# Patient Record
Sex: Female | Born: 1980 | Race: White | Hispanic: No | Marital: Married | Smoking: Never smoker
Health system: Southern US, Community
[De-identification: ages and names within clinical notes are randomized; demographics above are authoritative.]

## PROBLEM LIST (undated history)

## (undated) DIAGNOSIS — N329 Bladder disorder, unspecified: Secondary | ICD-10-CM

## (undated) DIAGNOSIS — G43909 Migraine, unspecified, not intractable, without status migrainosus: Secondary | ICD-10-CM

## (undated) HISTORY — DX: Bladder disorder, unspecified: N32.9

## (undated) HISTORY — PX: ABDOMINAL HYSTERECTOMY: SHX81

## (undated) HISTORY — PX: HYSTEROSCOPY: SHX211

## (undated) HISTORY — PX: HERNIA REPAIR: SHX51

## (undated) HISTORY — DX: Migraine, unspecified, not intractable, without status migrainosus: G43.909

---

## 2005-07-20 ENCOUNTER — Inpatient Hospital Stay: Payer: Self-pay | Admitting: Obstetrics & Gynecology

## 2007-02-28 ENCOUNTER — Ambulatory Visit: Payer: Self-pay | Admitting: Obstetrics & Gynecology

## 2007-03-02 ENCOUNTER — Inpatient Hospital Stay: Payer: Self-pay | Admitting: Obstetrics & Gynecology

## 2008-12-18 ENCOUNTER — Observation Stay: Payer: Self-pay | Admitting: Obstetrics and Gynecology

## 2009-01-07 ENCOUNTER — Ambulatory Visit: Payer: Self-pay | Admitting: Obstetrics & Gynecology

## 2009-01-09 ENCOUNTER — Inpatient Hospital Stay: Payer: Self-pay | Admitting: Obstetrics & Gynecology

## 2011-05-06 ENCOUNTER — Ambulatory Visit: Payer: Self-pay | Admitting: Obstetrics & Gynecology

## 2011-05-13 ENCOUNTER — Ambulatory Visit: Payer: Self-pay | Admitting: Obstetrics & Gynecology

## 2011-07-30 LAB — CBC AND DIFFERENTIAL
HCT: 38 % (ref 36–46)
Hemoglobin: 13.4 g/dL (ref 12.0–16.0)

## 2011-07-30 LAB — TSH: TSH: 1.85 u[IU]/mL (ref 0.41–5.90)

## 2013-01-11 ENCOUNTER — Encounter: Payer: Self-pay | Admitting: Internal Medicine

## 2013-01-26 ENCOUNTER — Encounter: Payer: Self-pay | Admitting: Internal Medicine

## 2013-01-26 ENCOUNTER — Other Ambulatory Visit (HOSPITAL_COMMUNITY)
Admission: RE | Admit: 2013-01-26 | Discharge: 2013-01-26 | Disposition: A | Discharge status: Home / Self Care | Payer: BC Managed Care – PPO | Source: Ambulatory Visit | Attending: Internal Medicine | Admitting: Internal Medicine

## 2013-01-26 ENCOUNTER — Ambulatory Visit (INDEPENDENT_AMBULATORY_CARE_PROVIDER_SITE_OTHER): Payer: BC Managed Care – PPO | Admitting: Internal Medicine

## 2013-01-26 VITALS — BP 126/82 | HR 80 | Temp 97.9°F | Resp 18 | Ht 67.0 in | Wt 137.5 lb

## 2013-01-26 DIAGNOSIS — R5381 Other malaise: Secondary | ICD-10-CM

## 2013-01-26 DIAGNOSIS — Z1151 Encounter for screening for human papillomavirus (HPV): Secondary | ICD-10-CM | POA: Insufficient documentation

## 2013-01-26 DIAGNOSIS — N926 Irregular menstruation, unspecified: Secondary | ICD-10-CM

## 2013-01-26 DIAGNOSIS — R5383 Other fatigue: Secondary | ICD-10-CM

## 2013-01-26 DIAGNOSIS — Z124 Encounter for screening for malignant neoplasm of cervix: Secondary | ICD-10-CM

## 2013-01-26 DIAGNOSIS — Z01419 Encounter for gynecological examination (general) (routine) without abnormal findings: Secondary | ICD-10-CM | POA: Insufficient documentation

## 2013-01-26 LAB — COMPREHENSIVE METABOLIC PANEL
AST: 10 U/L (ref 0–37)
Albumin: 4 g/dL (ref 3.5–5.2)
BUN: 16 mg/dL (ref 6–23)
CO2: 27 mEq/L (ref 19–32)
Calcium: 8.8 mg/dL (ref 8.4–10.5)
Chloride: 102 mEq/L (ref 96–112)
Creatinine, Ser: 0.7 mg/dL (ref 0.4–1.2)
GFR: 103.7 mL/min (ref >60.00)
Glucose, Bld: 93 mg/dL (ref 70–99)

## 2013-01-26 LAB — CBC WITH DIFFERENTIAL/PLATELET
Basophils Absolute: 0 10*3/uL (ref 0.0–0.1)
Basophils Relative: 0.3 % (ref 0.0–3.0)
HCT: 38.8 % (ref 36.0–46.0)
Hemoglobin: 12.9 g/dL (ref 12.0–15.0)
Lymphs Abs: 1.6 10*3/uL (ref 0.7–4.0)
Monocytes Relative: 4.6 % (ref 3.0–12.0)
Neutro Abs: 8.5 10*3/uL — ABNORMAL HIGH (ref 1.4–7.7)
RBC: 4.3 Mil/uL (ref 3.87–5.11)
RDW: 13.5 % (ref 11.5–14.6)

## 2013-01-26 LAB — HCG, QUANTITATIVE, PREGNANCY: hCG, Beta Chain, Quant, S: 0.1 m[IU]/mL

## 2013-01-28 ENCOUNTER — Encounter: Payer: Self-pay | Admitting: Internal Medicine

## 2013-01-28 DIAGNOSIS — N926 Irregular menstruation, unspecified: Secondary | ICD-10-CM | POA: Insufficient documentation

## 2013-01-28 NOTE — Assessment & Plan Note (Signed)
**Note De-Identified E[place]leton Obfuscation** Spotting and bleeding as outlined.  Check serum pregnancy test.  Is s/p hysteroscopy.  Persistent discharge now. With BRB recently.  Will refer back to GYN for evaluation.

## 2013-01-28 NOTE — Progress Notes (Signed)
**Note De-identified Lemme Obfuscation**  **Note De-Identified Ocain Obfuscation** Subjective:    Patient ID: Sandy Jefferson, female    DOB: Jul 30, 1981, 32 y.o.   MRN: [id number]  HPI 32 year old female with past history of previous irregular menstrual period.  Is s/p hysteroscopy.  Here today to follow up on this as well as for her complete physical exam and to transfer her care here to [facility].  She also had a hernia repair.  States still doing well with the hernia repair.  Did well for approximately 1-1 1/2 years regarding her periods after the hysteroscopy.  Now will intermittently notice some sharp lower pelvic pain.  Occasional spotting over the last several months.  Discharge.  Recently had BRB.  Some clots.  Heavier bleeding.  Some fatigue.  Weight gain.  Just spotting now.  She is currently being treated for poison oak.  Taking prednisone.  Better.  No sob.  No increased congestion.  Bowels stable.  Some problems with her knees.  Right > left.  Some problems with getting up after down on her knees.     Past Medical History  Diagnosis Date  . Migraine headache   . Bladder troubles     Review of Systems Patient denies any headache, lightheadedness or dizziness.  No sinus or allergy symptoms.  No chest pain, tightness or palpitations.  No increased shortness of breath, cough or congestion.  No nausea or vomiting.  No acid reflux.  Some intermittent lower pelvic pain as outlined.   No bowel change, such as diarrhea, constipation, BRBPR or melana.  No urine change.   Menstrual change as outlined.       Objective:   Physical Exam Filed Vitals:   01/26/13 1101  BP: 126/82  Pulse: 80  Temp: 97.9 F (36.6 C)  Resp: 38   32 year old female in no acute distress.   HEENT:  Nares- clear.  Oropharynx - without lesions. NECK:  Supple.  Nontender.  No audible bruit.  HEART:  Appears to be regular. LUNGS:  No crackles or wheezing audible.  Respirations even and unlabored.  RADIAL PULSE:  Equal bilaterally.    BREASTS:  No nipple discharge or nipple retraction present.  Could  not appreciate any distinct nodules or axillary adenopathy.  ABDOMEN:  Soft, nontender.  Bowel sounds present and normal.  No audible abdominal bruit.  GU:  Normal external genitalia.  Vaginal vault without lesions.  Cervix identified - no polyp.   Pap performed. Could not appreciate any adnexal masses or tenderness.   RECTAL:  Not performed.    EXTREMITIES:  No increased edema present.  DP pulses palpable and equal bilaterally.          Assessment & Plan:  FATIGUE.  Will check cbc, met c and tsh.   HERNIA.  Has done well s/p surgery.  No pain or problems.  Follow.   DERM.  With poison oak. On prednisone.  Better.  Follow.    WEIGHT GAIN.  Will check tsh.  Follow.   HEALTH MAINTENANCE.  Physical today.  Pelvic/pap today.

## 2013-01-29 ENCOUNTER — Telehealth: Payer: Self-pay | Admitting: Internal Medicine

## 2013-01-29 NOTE — Telephone Encounter (Signed)
**Note De-Identified Bonfanti Obfuscation** FYI - she wants to hold on the referral.

## 2013-01-29 NOTE — Telephone Encounter (Signed)
**Note De-Identified Messineo Obfuscation** Message copied by Charm Barges on Mon Jan 29, 2013  6:17 PM ------      Message from: Marlene Lard      Created: Mon Jan 29, 2013  5:21 PM       Patient said that she is going to wait a couple of weeks and then let us know. ------

## 2013-01-30 ENCOUNTER — Encounter: Payer: Self-pay | Admitting: Internal Medicine

## 2014-01-03 ENCOUNTER — Telehealth: Payer: Self-pay | Admitting: Internal Medicine

## 2014-01-03 NOTE — Telephone Encounter (Signed)
**Note De-Identified Ferre Obfuscation** Pt left vm to needing to schedule annual cpe.  Pt has already been scheduled 4/7 @ 10:30 a.m.  Unable to leave msg on home phone (per DPR), no machine picked up.

## 2014-01-29 ENCOUNTER — Ambulatory Visit (INDEPENDENT_AMBULATORY_CARE_PROVIDER_SITE_OTHER): Payer: BC Managed Care – PPO | Admitting: Internal Medicine

## 2014-01-29 ENCOUNTER — Encounter: Payer: Self-pay | Admitting: Internal Medicine

## 2014-01-29 VITALS — BP 110/70 | HR 67 | Temp 98.6°F | Ht 64.5 in | Wt 145.5 lb

## 2014-01-29 DIAGNOSIS — R5381 Other malaise: Secondary | ICD-10-CM

## 2014-01-29 DIAGNOSIS — M549 Dorsalgia, unspecified: Secondary | ICD-10-CM

## 2014-01-29 DIAGNOSIS — Z13220 Encounter for screening for lipoid disorders: Secondary | ICD-10-CM

## 2014-01-29 DIAGNOSIS — N926 Irregular menstruation, unspecified: Secondary | ICD-10-CM

## 2014-01-29 DIAGNOSIS — R5383 Other fatigue: Secondary | ICD-10-CM

## 2014-01-29 LAB — CBC WITH DIFFERENTIAL/PLATELET
BASOS ABS: 0 10*3/uL (ref 0.0–0.1)
BASOS PCT: 0.5 % (ref 0.0–3.0)
EOS PCT: 6.9 % — AB (ref 0.0–5.0)
Eosinophils Absolute: 0.5 10*3/uL (ref 0.0–0.7)
HEMATOCRIT: 38 % (ref 36.0–46.0)
HEMOGLOBIN: 12.8 g/dL (ref 12.0–15.0)
LYMPHS ABS: 2.1 10*3/uL (ref 0.7–4.0)
Lymphocytes Relative: 29.4 % (ref 12.0–46.0)
MCHC: 33.7 g/dL (ref 30.0–36.0)
MCV: 92 fl (ref 78.0–100.0)
MONOS PCT: 4.7 % (ref 3.0–12.0)
Monocytes Absolute: 0.3 10*3/uL (ref 0.1–1.0)
NEUTROS ABS: 4.1 10*3/uL (ref 1.4–7.7)
Neutrophils Relative %: 58.5 % (ref 43.0–77.0)
Platelets: 202 10*3/uL (ref 150.0–400.0)
RBC: 4.13 Mil/uL (ref 3.87–5.11)
RDW: 13.3 % (ref 11.5–14.6)
WBC: 7.1 10*3/uL (ref 4.5–10.5)

## 2014-01-29 LAB — COMPREHENSIVE METABOLIC PANEL
ALT: 15 U/L (ref 0–35)
AST: 13 U/L (ref 0–37)
Albumin: 4.2 g/dL (ref 3.5–5.2)
Alkaline Phosphatase: 27 U/L — ABNORMAL LOW (ref 39–117)
BILIRUBIN TOTAL: 1 mg/dL (ref 0.3–1.2)
BUN: 17 mg/dL (ref 6–23)
CALCIUM: 9.1 mg/dL (ref 8.4–10.5)
CO2: 28 meq/L (ref 19–32)
CREATININE: 0.7 mg/dL (ref 0.4–1.2)
Chloride: 106 mEq/L (ref 96–112)
GFR: 101.38 mL/min (ref >60.00)
GLUCOSE: 83 mg/dL (ref 70–99)
Potassium: 4.4 mEq/L (ref 3.5–5.1)
Sodium: 139 mEq/L (ref 135–145)
Total Protein: 6.8 g/dL (ref 6.0–8.3)

## 2014-01-29 LAB — LIPID PANEL
CHOLESTEROL: 177 mg/dL (ref 0–200)
HDL: 42.7 mg/dL (ref >39.00)
LDL Cholesterol: 122 mg/dL — ABNORMAL HIGH (ref 0–99)
TRIGLYCERIDES: 60 mg/dL (ref 0.0–149.0)
Total CHOL/HDL Ratio: 4
VLDL: 12 mg/dL (ref 0.0–40.0)

## 2014-01-29 LAB — SEDIMENTATION RATE: Sed Rate: 12 mm/hr (ref 0–22)

## 2014-01-29 LAB — TSH: TSH: 1.91 u[IU]/mL (ref 0.35–5.50)

## 2014-01-29 NOTE — Progress Notes (Signed)
**Note De-identified Twichell Obfuscation** Pre-visit discussion using our clinic review tool. No additional management support is needed unless otherwise documented below in the visit note.  

## 2014-01-29 NOTE — Progress Notes (Signed)
**Note De-identified Upadhyay Obfuscation**  **Note De-Identified Sutherland Obfuscation** Subjective:    Patient ID: Sandy Jefferson, female    DOB: 08-13-1981, 33 y.o.   MRN: [id number]  HPI 33 year old female with past history of previous irregular menstrual period.  Is s/p hysteroscopy.  Here today to follow up on this as well as for her complete physical exam.   She also had a hernia repair.  States still doing well with the hernia repair.  Will intermittently notice some sharp lower pelvic pain.  Occasional spotting over the last several months.  Discharge.  Recently had BRB.  Some clots.  Heavier bleeding.  Some fatigue.  These are similar symptoms that she was having last year.  Saw gyn.  States had "cyst".  Followed with serial ultrasounds.  States when this was smaller, she felt better.  Did well for approximately 6 months and then the symptoms returned.  Started two months ago.  No pain today.  Is intermittent.  Has noticed the fatigue and weight gain again.  No sob.  No increased congestion.  Bowels stable.      Past Medical History  Diagnosis Date  . Migraine headache   . Bladder troubles     Review of Systems Patient denies any headache, lightheadedness or dizziness.  No sinus or allergy symptoms.  No chest pain, tightness or palpitations.  No increased shortness of breath, cough or congestion.  No nausea or vomiting.  No acid reflux.  Some intermittent lower pelvic pain as outlined.   No bowel change, such as diarrhea, constipation, BRBPR or melana.  No urine change.   Menstrual change as outlined.  Describes BRB and clots.  Reports some low back pain.  No radiation of pain.      Objective:   Physical Exam  Filed Vitals:   01/29/14 1036  BP: 110/70  Pulse: 67  Temp: 98.6 F (54 C)   33 year old female in no acute distress.   HEENT:  Nares- clear.  Oropharynx - without lesions. NECK:  Supple.  Nontender.  No audible bruit.  HEART:  Appears to be regular. LUNGS:  No crackles or wheezing audible.  Respirations even and unlabored.  RADIAL PULSE:  Equal bilaterally.     BREASTS:  No nipple discharge or nipple retraction present.  Could not appreciate any distinct nodules or axillary adenopathy.  ABDOMEN:  Soft, nontender.  Bowel sounds present and normal.  No audible abdominal bruit.  GU:  Not performed.    EXTREMITIES:  No increased edema present.  DP pulses palpable and equal bilaterally.          Assessment & Plan:  FATIGUE.  Will check cbc, met c and tsh.   HERNIA.  Has done well s/p surgery.  No pain or problems.  Follow.   WEIGHT GAIN.  Will check tsh.  Follow.   HEALTH MAINTENANCE.  Physical today.  Pelvic/pap 01/26/13 negative pap with negative HPV.

## 2014-01-30 ENCOUNTER — Encounter: Payer: Self-pay | Admitting: *Deleted

## 2014-02-03 ENCOUNTER — Encounter: Payer: Self-pay | Admitting: Internal Medicine

## 2014-02-03 ENCOUNTER — Other Ambulatory Visit: Payer: Self-pay | Admitting: Internal Medicine

## 2014-02-03 DIAGNOSIS — M549 Dorsalgia, unspecified: Secondary | ICD-10-CM | POA: Insufficient documentation

## 2014-02-03 DIAGNOSIS — N926 Irregular menstruation, unspecified: Secondary | ICD-10-CM

## 2014-02-03 DIAGNOSIS — R102 Pelvic and perineal pain: Secondary | ICD-10-CM

## 2014-02-03 NOTE — Assessment & Plan Note (Signed)
**Note De-Identified Robillard Obfuscation** Stretches.  Check esr.  Get gyn problems evaluated.  Back pain may improve.

## 2014-02-03 NOTE — Progress Notes (Signed)
**Note De-identified Cripps Obfuscation** Order placed for gyn referral.  

## 2014-02-03 NOTE — Assessment & Plan Note (Signed)
**Note De-Identified Sciarra Obfuscation** Spotting and bleeding as outlined.   Is s/p hysteroscopy.  With BRB recently.  Intermittent bleeding and spotting now.  Will refer back to GYN for evaluation.  She desires second opinion.

## 2014-02-06 ENCOUNTER — Encounter: Payer: Self-pay | Admitting: Emergency Medicine

## 2014-04-24 HISTORY — PX: TOTAL ABDOMINAL HYSTERECTOMY W/ BILATERAL SALPINGOOPHORECTOMY: SHX83

## 2014-05-01 ENCOUNTER — Ambulatory Visit: Payer: Self-pay | Admitting: Obstetrics and Gynecology

## 2014-05-01 LAB — BASIC METABOLIC PANEL
Anion Gap: 7 (ref 7–16)
BUN: 16 mg/dL (ref 7–18)
CHLORIDE: 104 mmol/L (ref 98–107)
CREATININE: 0.81 mg/dL (ref 0.60–1.30)
Calcium, Total: 8.4 mg/dL — ABNORMAL LOW (ref 8.5–10.1)
Co2: 29 mmol/L (ref 21–32)
EGFR (African American): >60
EGFR (Non-African Amer.): >60
Glucose: 94 mg/dL (ref 65–99)
Osmolality: 280 (ref 275–301)
POTASSIUM: 4.1 mmol/L (ref 3.5–5.1)
Sodium: 140 mmol/L (ref 136–145)

## 2014-05-01 LAB — CBC
HCT: 38.2 % (ref 35.0–47.0)
HGB: 12.9 g/dL (ref 12.0–16.0)
MCH: 31.5 pg (ref 26.0–34.0)
MCHC: 33.7 g/dL (ref 32.0–36.0)
MCV: 93 fL (ref 80–100)
Platelet: 209 10*3/uL (ref 150–440)
RBC: 4.09 10*6/uL (ref 3.80–5.20)
RDW: 12.6 % (ref 11.5–14.5)
WBC: 7.9 10*3/uL (ref 3.6–11.0)

## 2014-05-06 ENCOUNTER — Inpatient Hospital Stay: Payer: Self-pay | Admitting: Obstetrics and Gynecology

## 2014-05-07 LAB — HEMOGLOBIN: HGB: 10.7 g/dL — AB (ref 12.0–16.0)

## 2014-05-09 LAB — PATHOLOGY REPORT

## 2014-05-28 ENCOUNTER — Ambulatory Visit: Payer: BC Managed Care – PPO

## 2014-05-28 ENCOUNTER — Telehealth: Payer: Self-pay | Admitting: *Deleted

## 2014-05-28 NOTE — Telephone Encounter (Signed)
**Note De-Identified Rawdon Obfuscation** Do you have a copy of the form.  I did not get a copy.  Thanks.

## 2014-05-28 NOTE — Telephone Encounter (Signed)
**Note De-Identified Mesenbrink Obfuscation** Pt presented to clinic for TB test. Prior to giving, pt stated that she was starting Prednisone today x 14 days for severe poison oak. Dr. Nicki Reaper notified and pt advised to not have TB test done today and to wait 4 weeks after completing Prednisone. Pt verbalized understanding, will schedule a nurse visit in 6 weeks. 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 Her last visit was 01/29/14 for cpe. Form given to Dr. Nicki Reaper. 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 States had Hep B in college, but does not feel that labs/immunization necessary for form completion. Advised to check with school system to verify that she does not need these done.

## 2014-05-29 DIAGNOSIS — Z0279 Encounter for issue of other medical certificate: Secondary | ICD-10-CM

## 2014-05-30 NOTE — Telephone Encounter (Signed)
**Note De-Identified Cottier Obfuscation** Left patient on voicemail informing her that her form has been completed. Need to know if she wants it mailed, faxed, or picked up.

## 2014-06-13 NOTE — Telephone Encounter (Signed)
**Note De-Identified Asper Obfuscation** Never heard back from patient. Mailed form back to patient & sent copy to scan

## 2014-07-10 ENCOUNTER — Ambulatory Visit (INDEPENDENT_AMBULATORY_CARE_PROVIDER_SITE_OTHER): Payer: BC Managed Care – PPO | Admitting: *Deleted

## 2014-07-10 DIAGNOSIS — Z111 Encounter for screening for respiratory tuberculosis: Secondary | ICD-10-CM

## 2014-07-12 LAB — TB SKIN TEST
Induration: 0 mm
TB Skin Test: NEGATIVE

## 2014-07-29 ENCOUNTER — Telehealth: Payer: Self-pay | Admitting: Internal Medicine

## 2014-07-29 NOTE — Telephone Encounter (Signed)
**Note De-Identified Quinones Obfuscation** LMTCB to change appt to 8:30 on 10/7.msn

## 2014-07-31 ENCOUNTER — Encounter: Payer: Self-pay | Admitting: Internal Medicine

## 2014-07-31 ENCOUNTER — Ambulatory Visit (INDEPENDENT_AMBULATORY_CARE_PROVIDER_SITE_OTHER): Payer: BC Managed Care – PPO | Admitting: Internal Medicine

## 2014-07-31 VITALS — BP 120/78 | HR 74 | Temp 98.0°F | Ht 64.5 in | Wt 143.0 lb

## 2014-07-31 DIAGNOSIS — R5383 Other fatigue: Secondary | ICD-10-CM

## 2014-07-31 DIAGNOSIS — E0789 Other specified disorders of thyroid: Secondary | ICD-10-CM

## 2014-07-31 DIAGNOSIS — E2839 Other primary ovarian failure: Secondary | ICD-10-CM

## 2014-07-31 LAB — CBC WITH DIFFERENTIAL/PLATELET
BASOS ABS: 0 10*3/uL (ref 0.0–0.1)
Basophils Relative: 0.5 % (ref 0.0–3.0)
EOS ABS: 0.2 10*3/uL (ref 0.0–0.7)
Eosinophils Relative: 2.8 % (ref 0.0–5.0)
HEMATOCRIT: 39.2 % (ref 36.0–46.0)
Hemoglobin: 13.1 g/dL (ref 12.0–15.0)
LYMPHS ABS: 2.2 10*3/uL (ref 0.7–4.0)
Lymphocytes Relative: 33.1 % (ref 12.0–46.0)
MCHC: 33.3 g/dL (ref 30.0–36.0)
MCV: 90.5 fl (ref 78.0–100.0)
MONO ABS: 0.4 10*3/uL (ref 0.1–1.0)
Monocytes Relative: 5.4 % (ref 3.0–12.0)
Neutro Abs: 3.8 10*3/uL (ref 1.4–7.7)
Neutrophils Relative %: 58.2 % (ref 43.0–77.0)
PLATELETS: 214 10*3/uL (ref 150.0–400.0)
RBC: 4.33 Mil/uL (ref 3.87–5.11)
RDW: 13.2 % (ref 11.5–15.5)
WBC: 6.6 10*3/uL (ref 4.0–10.5)

## 2014-07-31 LAB — TSH: TSH: 2 u[IU]/mL (ref 0.35–4.50)

## 2014-07-31 NOTE — Progress Notes (Signed)
**Note De-identified Acebo Obfuscation** Pre visit review using our clinic review tool, if applicable. No additional management support is needed unless otherwise documented below in the visit note. 

## 2014-08-01 ENCOUNTER — Encounter: Payer: Self-pay | Admitting: *Deleted

## 2014-08-04 ENCOUNTER — Encounter: Payer: Self-pay | Admitting: Internal Medicine

## 2014-08-04 DIAGNOSIS — E2839 Other primary ovarian failure: Secondary | ICD-10-CM | POA: Insufficient documentation

## 2014-08-04 NOTE — Assessment & Plan Note (Signed)
**Note De-Identified Blodgett Obfuscation** Thyroid fullness noted on exam.  Check tsh.  Check thyroid ultrasound.

## 2014-08-04 NOTE — Progress Notes (Signed)
**Note De-identified Villalon Obfuscation**  **Note De-Identified Oche[place] Obfuscation** Subjective:    Patient ID: Sandy Jefferson, female    DOB: [birth year], 33 y.o.   MRN: [id number]  HPI 33 year old female with past history of previous irregular menstrual period.  Is s/p hysteroscopy initially and now s/p TAH/BSO.   Here today for a scheduled follow up.   Has noticed some increased  fatigue.  Breathing stable.  No cardiac symptoms with increased activity or exertion.  No acid reflux.  No bowel change.  Eating and drinking well.  Seeing Dr Enzo Bi.  On estrogen.  States if she takes the whole estrogen, too sleepy.  Just started 1/2 tablet per day.       Past Medical History  Diagnosis Date  . Migraine headache   . Bladder troubles     Review of Systems Patient denies any headache, lightheadedness or dizziness.  No sinus or allergy symptoms.  No chest pain, tightness or palpitations.  No increased shortness of breath, cough or congestion.  No nausea or vomiting.  No acid reflux.  No bowel change, such as diarrhea, constipation, BRBPR or melana.  No urine change.  Is s/p TAH/BSO.  On estrogen.  Trying to get regulated.  Some fatigue.       Objective:   Physical Exam  Filed Vitals:   07/31/14 0806  BP: 120/78  Pulse: 74  Temp: 98 F (36.7 C)  blood pressure recheck:  61/57  33 year old female in no acute distress.   HEENT:  Nares- clear.  Oropharynx - without lesions. NECK:  Supple.  Nontender.  No audible bruit.  Thyroid fullness noted.   HEART:  Appears to be regular. LUNGS:  No crackles or wheezing audible.  Respirations even and unlabored.  RADIAL PULSE:  Equal bilaterally.   ABDOMEN:  Soft, nontender.  Bowel sounds present and normal.  No audible abdominal bruit.  EXTREMITIES:  No increased edema present.  DP pulses palpable and equal bilaterally.          Assessment & Plan:  FATIGUE.  Will check cbc, met c and tsh.  Recent surgery.  Still recovering.  Regulating her hormones.   HERNIA.  Has done well s/p surgery.  No pain or problems.  Follow.   HEALTH  MAINTENANCE.  Physical 01/29/14. Marland Kitchen  Pelvic/pap 01/26/13 negative pap with negative HPV.    I spent 25 minutes with the patient and more than 50% of the time was spent in consultation regarding the above.

## 2014-08-04 NOTE — Assessment & Plan Note (Signed)
**Note De-Identified Larimer Obfuscation** Is s/p TAH/BSO.  Regulating her estrogen dosing.  Is now on 1/2 q day.  Follow.

## 2014-08-20 ENCOUNTER — Telehealth: Payer: Self-pay | Admitting: Internal Medicine

## 2014-08-20 NOTE — Telephone Encounter (Signed)
**Note De-Identified Florio Obfuscation** Caller name: [facility], [facility] Relation to pt: self  Call back number: [phone] (new cell phone # Pharmacy:  Reason for call: pt returning your call

## 2014-08-27 ENCOUNTER — Ambulatory Visit: Payer: Self-pay | Admitting: Internal Medicine

## 2014-08-28 ENCOUNTER — Encounter: Payer: Self-pay | Admitting: Internal Medicine

## 2014-08-28 ENCOUNTER — Telehealth: Payer: Self-pay | Admitting: Internal Medicine

## 2014-08-28 DIAGNOSIS — E041 Nontoxic single thyroid nodule: Secondary | ICD-10-CM

## 2014-08-28 NOTE — Telephone Encounter (Signed)
**Note De-Identified Herrmann Obfuscation** Pt notified of thyroid ultrasound results.  Thyroid nodule 1.1cm.  Agrees to referral to endocrinology.  Order for referral placed.

## 2014-10-03 ENCOUNTER — Telehealth: Payer: Self-pay

## 2014-10-03 NOTE — Telephone Encounter (Signed)
**Note De-Identified Gros Obfuscation** Pt was scheduled with Dr Loanne Drilling for 10/08/14 for evaluation of thyroid nodule.

## 2014-10-03 NOTE — Telephone Encounter (Signed)
**Note De-Identified Cedeno Obfuscation** The patient called and stated she has spoken with [facility] and they are unable to get in her before the end of the year.  She is wondering if any of the [facility] endocrinologist do biopsies?

## 2014-10-03 NOTE — Telephone Encounter (Signed)
**Note De-Identified Derryberry Obfuscation** Please advise (this is something that could also be available through Radiology @ [facility])

## 2014-10-04 ENCOUNTER — Ambulatory Visit: Payer: BC Managed Care – PPO | Admitting: Internal Medicine

## 2014-10-08 ENCOUNTER — Ambulatory Visit: Payer: BC Managed Care – PPO | Admitting: Endocrinology

## 2014-10-08 ENCOUNTER — Ambulatory Visit (INDEPENDENT_AMBULATORY_CARE_PROVIDER_SITE_OTHER): Payer: BC Managed Care – PPO | Admitting: Endocrinology

## 2014-10-08 ENCOUNTER — Encounter: Payer: Self-pay | Admitting: Endocrinology

## 2014-10-08 VITALS — BP 120/82 | HR 83 | Temp 98.0°F | Ht 64.5 in | Wt 141.0 lb

## 2014-10-08 DIAGNOSIS — E041 Nontoxic single thyroid nodule: Secondary | ICD-10-CM

## 2014-10-08 NOTE — Patient Instructions (Addendum)
**Note De-Identified Rucci Obfuscation** you will receive a phone call, about a day and time for an appointment, for the biopsy. If no cancer is seen, Please come back for a follow-up appointment in 6 months. If no cancer is seen, you could investigate other causes of the swelling symptoms.

## 2014-10-08 NOTE — Progress Notes (Signed)
**Note De-Identified Sorlie Obfuscation** Subjective:    Patient ID: Sandy Jefferson, female    DOB: [birth year], 33 y.o.   MRN: [id number]  HPI 1 month ago, pt was noted on physical exam to have slight swelling at the anterior neck, but no assoc pain.  she has no h/o XRT to the neck.   Past Medical History  Diagnosis Date  . Migraine headache   . Bladder troubles     Past Surgical History  Procedure Laterality Date  . Cesarean section    . Hysteroscopy    . Hernia repair      History   Social History  . Marital Status: Married    Spouse Name: N/A    Number of Children: 4  . Years of Education: N/A   Occupational History  . Not on file.   Social History Main Topics  . Smoking status: Never Smoker   . Smokeless tobacco: Never Used  . Alcohol Use: Yes     Comment: rarely  . Drug Use: No  . Sexual Activity: Not on file   Other Topics Concern  . Not on file   Social History Narrative    No current outpatient prescriptions on file prior to visit.   No current facility-administered medications on file prior to visit.    Allergies  Allergen Reactions  . Aspirin     Family History  Problem Relation Age of Onset  . Heart disease Brother   . Hyperlipidemia Mother   . Hyperlipidemia Father   . Cancer Maternal Aunt     breast  . Stroke Maternal Grandmother   . Hyperlipidemia Maternal Grandfather   . Cancer Paternal Grandmother     lung  . Diabetes Paternal Grandmother   . Diabetes Paternal Grandfather   . Colon cancer      great grandmother    BP 120/82 mmHg  Pulse 83  Temp(Src) 98 F (36.7 C) (Oral)  Ht 5' 4.5" (1.638 m)  Wt 141 lb (63.957 kg)  BMI 23.84 kg/m2  SpO2 97%  Review of Systems denies depression, hair loss, muscle cramps, sob, constipation, numbness, blurry vision, cold intolerance, dry skin, rhinorrhea, and syncope.  She has gained weight.  She has easy bruising.    Objective:   Physical Exam VS: see vs page GEN: no distress HEAD: head: no deformity eyes: no periorbital  swelling, no proptosis external nose and ears are normal mouth: no lesion seen NECK: I can feel slight fullness at the right thyroid area, but I can't tell the nodule itself with certainty. CHEST WALL: no deformity LUNGS:  Clear to auscultation.  CV: reg rate and rhythm, no murmur MUSCULOSKELETAL: muscle bulk and strength are grossly normal.  no obvious joint swelling.  gait is normal and steady EXTEMITIES: no deformity.  no edema NEURO:  cn 2-12 grossly intact.   readily moves all 4's.  sensation is intact to touch on all 4's SKIN:  Normal texture and temperature.  No rash or suspicious lesion is visible.   NODES:  None palpable at the neck. PSYCH: alert, well-oriented.  Does not appear anxious nor depressed.  Lab Results  Component Value Date   TSH 2.00 07/31/2014   Radiol: i reviewed [place] report    Assessment & Plan:  Thyroid nodule, new: pt requests bx Neck swelling: does not appear to be thyroid-related.   Patient is advised the following: Patient Instructions  you will receive a phone call, about a day and time for an appointment, for the biopsy. If **Note De-Identified Huaracha Obfuscation** no cancer is seen, Please come back for a follow-up appointment in 6 months. If no cancer is seen, you could investigate other causes of the swelling symptoms.

## 2014-10-10 ENCOUNTER — Ambulatory Visit: Payer: BC Managed Care – PPO | Admitting: Endocrinology

## 2014-11-08 ENCOUNTER — Telehealth: Payer: Self-pay | Admitting: Endocrinology

## 2014-11-08 NOTE — Telephone Encounter (Signed)
**Note De-Identified Lemarr Obfuscation** Juanita from [facility], stated that she need and order from Dr Loanne Drilling in order to do procedure on patient,  Fax # [phone]

## 2014-11-08 NOTE — Telephone Encounter (Addendum)
**Note De-Identified Rudell Obfuscation** Orders faxed to [facility].

## 2014-11-11 ENCOUNTER — Ambulatory Visit: Payer: Self-pay | Admitting: Endocrinology

## 2014-11-13 ENCOUNTER — Telehealth: Payer: Self-pay | Admitting: Endocrinology

## 2014-11-13 DIAGNOSIS — E041 Nontoxic single thyroid nodule: Secondary | ICD-10-CM

## 2014-11-13 NOTE — Telephone Encounter (Signed)
**Note De-Identified Minix Obfuscation** i called pt.  Ref surg

## 2014-12-03 ENCOUNTER — Encounter: Payer: Self-pay | Admitting: Endocrinology

## 2014-12-03 ENCOUNTER — Encounter: Payer: Self-pay | Admitting: Nurse Practitioner

## 2014-12-03 ENCOUNTER — Ambulatory Visit (INDEPENDENT_AMBULATORY_CARE_PROVIDER_SITE_OTHER): Payer: BLUE CROSS/BLUE SHIELD | Admitting: Nurse Practitioner

## 2014-12-03 VITALS — BP 110/62 | HR 69 | Temp 97.6°F | Resp 12 | Ht 64.5 in | Wt 147.8 lb

## 2014-12-03 DIAGNOSIS — R21 Rash and other nonspecific skin eruption: Secondary | ICD-10-CM | POA: Insufficient documentation

## 2014-12-03 DIAGNOSIS — J0100 Acute maxillary sinusitis, unspecified: Secondary | ICD-10-CM

## 2014-12-03 DIAGNOSIS — J019 Acute sinusitis, unspecified: Secondary | ICD-10-CM | POA: Insufficient documentation

## 2014-12-03 NOTE — Progress Notes (Signed)
**Note De-identified Storck Obfuscation** Pre visit review using our clinic review tool, if applicable. No additional management support is needed unless otherwise documented below in the visit note. 

## 2014-12-03 NOTE — Assessment & Plan Note (Addendum)
**Note De-Identified Salmela Obfuscation** Stable. Only 5 days into course. Probably viral. Father has similar symptoms. Will try OTC alkaseltzer, flonase, and benadry. FU prn worsening/failure to improve.

## 2014-12-03 NOTE — Assessment & Plan Note (Signed)
**Note De-Identified Canion Obfuscation** Worsening. Will try benadryl OTC. If not helpful will defer to her dermatologist. FU prn worsening/failure to improve.

## 2014-12-03 NOTE — Patient Instructions (Addendum)
**Note De-Identified Davie Obfuscation** Follow up if worsening/failure to improve.   Viral Infections A viral infection can be caused by different types of viruses.Most viral infections are not serious and resolve on their own. However, some infections may cause severe symptoms and may lead to further complications. SYMPTOMS Viruses can frequently cause:  Minor sore throat.  Aches and pains.  Headaches.  Runny nose.  Different types of rashes.  Watery eyes.  Tiredness.  Cough.  Loss of appetite.  Gastrointestinal infections, resulting in nausea, vomiting, and diarrhea. These symptoms do not respond to antibiotics because the infection is not caused by bacteria. However, you might catch a bacterial infection following the viral infection. This is sometimes called a "superinfection." Symptoms of such a bacterial infection may include:  Worsening sore throat with pus and difficulty swallowing.  Swollen neck glands.  Chills and a high or persistent fever.  Severe headache.  Tenderness over the sinuses.  Persistent overall ill feeling (malaise), muscle aches, and tiredness (fatigue).  Persistent cough.  Yellow, green, or brown mucus production with coughing. HOME CARE INSTRUCTIONS   Only take over-the-counter or prescription medicines for pain, discomfort, diarrhea, or fever as directed by your caregiver.  Drink enough water and fluids to keep your urine clear or pale yellow. Sports drinks can provide valuable electrolytes, sugars, and hydration.  Get plenty of rest and maintain proper nutrition. Soups and broths with crackers or rice are fine. SEEK IMMEDIATE MEDICAL CARE IF:   You have severe headaches, shortness of breath, chest pain, neck pain, or an unusual rash.  You have uncontrolled vomiting, diarrhea, or you are unable to keep down fluids.  You or your child has an oral temperature above 102 F (38.9 C), not controlled by medicine.  Your baby is older than 3 months with a rectal temperature of  102 F (38.9 C) or higher.  Your baby is 38 months old or younger with a rectal temperature of 100.4 F (38 C) or higher. MAKE SURE YOU:   Understand these instructions.  Will watch your condition.  Will get help right away if you are not doing well or get worse. Document Released: 07/21/2005 Document Revised: 01/03/2012 Document Reviewed: 02/15/2011 [facility] Patient Information 2015 [place], [place]. This information is not intended to replace advice given to you by your health care provider. Make sure you discuss any questions you have with your health care provider.

## 2014-12-03 NOTE — Progress Notes (Signed)
**Note De-Identified [place] Obfuscation** Subjective:    Patient ID: Sandy Jefferson, female    DOB: 11/06/1980, 34 y.o.   MRN: [id number]  HPI  Sandy Jefferson is a 34 yo female with a CC of rash on both elbows and sinus pressure x 5 days.   1) Rash- a week ago elbows popped up and lotion and other creams make it "mad". Triamcinolone- not helpful, red and tender. Pruritic when pressure applied to elbow.   Biopsy of thyroid right side of neck. Noticed pain on right side and itchy rash on posterior neck under hairline. Bx date 11/11/2014.   3) Pressure of maxillary sinuses, rhinorrhea draining, sore throat, mucus- green/yellow from throat x 5 days. Can not smell or taste anything. X 5 days   Treatment- Afrin, alkaseltzer for nasal congestion, Nyquil at night. Helpful a little bit.     Review of Systems  Constitutional: Negative for fever, chills, diaphoresis and fatigue.  HENT: Positive for congestion, postnasal drip, rhinorrhea, sinus pressure, sneezing and voice change. Negative for ear discharge, ear pain and sore throat.        Bx of thyroid- hoarse  Eyes: Negative for visual disturbance.  Respiratory: Negative for chest tightness, shortness of breath and wheezing.   Cardiovascular: Negative for chest pain, palpitations and leg swelling.  Gastrointestinal: Negative for nausea, vomiting and diarrhea.  Skin: Positive for rash.  Neurological: Positive for headaches. Negative for dizziness and weakness.   Past Medical History  Diagnosis Date  . Migraine headache   . Bladder troubles     History   Social History  . Marital Status: Married    Spouse Name: N/A    Number of Children: 4  . Years of Education: N/A   Occupational History  . Not on file.   Social History Main Topics  . Smoking status: Never Smoker   . Smokeless tobacco: Never Used  . Alcohol Use: Yes     Comment: rarely  . Drug Use: No  . Sexual Activity: Not on file   Other Topics Concern  . Not on file   Social History Narrative    Past Surgical  History  Procedure Laterality Date  . Cesarean section    . Hysteroscopy    . Hernia repair      Family History  Problem Relation Age of Onset  . Heart disease Brother   . Hyperlipidemia Mother   . Hyperlipidemia Father   . Cancer Maternal Aunt     breast  . Stroke Maternal Grandmother   . Hyperlipidemia Maternal Grandfather   . Cancer Paternal Grandmother     lung  . Diabetes Paternal Grandmother   . Diabetes Paternal Grandfather   . Colon cancer      great grandmother    Allergies  Allergen Reactions  . Aspirin     Current Outpatient Prescriptions on File Prior to Visit  Medication Sig Dispense Refill  . ESTROGENS CONJUGATED PO Take 0.5 mg by mouth.     No current facility-administered medications on file prior to visit.      Objective:   Physical Exam  Constitutional: She is oriented to person, place, and time. She appears well-developed and well-nourished. No distress.  BP 110/62 mmHg  Pulse 69  Temp(Src) 97.6 F (36.4 C) (Oral)  Resp 12  Ht 5' 4.5" (1.638 m)  Wt 147 lb 12.8 oz (67.042 kg)  BMI 24.99 kg/m2  SpO2 97%   HENT:  Head: Normocephalic and atraumatic.  Right Ear: External ear normal. **Note De-Identified [place] Obfuscation** Left Ear: External ear normal.  Mouth/Throat: No oropharyngeal exudate.  TM's clear bilaterally  Eyes: Conjunctivae and EOM are normal. Pupils are equal, round, and reactive to light. Right eye exhibits no discharge. Left eye exhibits no discharge. No scleral icterus.  Neck: Normal range of motion. Neck supple. Thyromegaly present.  Right side slightly enlarged- will have a nodule removed  Cardiovascular: Normal rate, regular rhythm, normal heart sounds and intact distal pulses.  Exam reveals no gallop and no friction rub.   No murmur heard. Pulmonary/Chest: Effort normal and breath sounds normal. No respiratory distress. She has no wheezes. She has no rales. She exhibits no tenderness.  Lymphadenopathy:    She has no cervical adenopathy.  Neurological: She is  alert and oriented to person, place, and time. No cranial nerve deficit. She exhibits normal muscle tone. Coordination normal.  Skin: Skin is warm and dry. No rash noted. She is not diaphoretic.     Psychiatric: She has a normal mood and affect. Her behavior is normal. Judgment and thought content normal.      Assessment & Plan:

## 2014-12-04 ENCOUNTER — Telehealth: Payer: Self-pay | Admitting: Endocrinology

## 2014-12-04 NOTE — Telephone Encounter (Signed)
**Note De-Identified [place] Obfuscation** Patient stated that no one has called her to schedule her surgery appointment.please advise

## 2014-12-04 NOTE — Telephone Encounter (Signed)
**Note De-Identified [place] Obfuscation** Contacted pt and advised [facility] is working on the referral. [facility] contacted about referral.

## 2015-02-07 ENCOUNTER — Other Ambulatory Visit: Payer: Self-pay | Admitting: Unknown Physician Specialty

## 2015-02-07 DIAGNOSIS — E041 Nontoxic single thyroid nodule: Secondary | ICD-10-CM

## 2015-02-15 NOTE — Op Note (Signed)
**Note De-Identified [place] Obfuscation** PATIENT NAMEALEISHA, PAONE MR#:  [id number] DATE OF BIRTH:  03/28/1981  DATE OF PROCEDURE:  05/06/2014  PREOPERATIVE DIAGNOSES:  1. Chronic pelvic pain.  2. Abnormal uterine bleeding.  3. Endometriosis.   POSTOPERATIVE DIAGNOSES:  1. Chronic pelvic pain.  2. Abnormal uterine bleeding.  3. Endometriosis.   OPERATIVE PROCEDURE: TAH, BSO.   SURGEON: Dr. Hassell Done Wilmarie Sparlin   FIRST ASSISTANT: Dr. Marcelline Mates.  ANESTHESIA: General endotracheal.   INDICATIONS: The patient is a 34 year old white female, para 4, 0-0-4, status post bilateral salpingectomy at the time of her last C-section, status post C-section x 4, status post endometrial ablation, presents now for surgical management of chronic abnormal uterine bleeding and pelvic pain.   FINDINGS AT SURGERY:  Revealed a grossly normal pelvis with the exception of bladder flap adhesions.   DESCRIPTION OF THE PROCEDURE: The patient was brought to the operating room where he was placed in the supine position. General anesthesia was induced without difficulty. A ChloraPrep and Betadine abdominal, perineal, intravaginal prep and drape was performed in standard fashion. A Foley catheter was placed and was draining clear yellow urine. A midline incision was made into the abdomen. The fascia was incised and extended superiorly and inferiorly. The peritoneum was entered. The pelvis was explored.  Two wet laps were placed to pack the bowel out of the pelvis. No retractors were needed except for a Richardson retractor in order to complete the procedure. The procedure was then performed in standard fashion. The round ligaments were doubly clamped, cut, and stick tied using 0 Vicryl sutures. The bladder flap was created through sharp dissection anteriorly. The posterior leaf of the broad ligament was opened. The infundibulopelvic ligaments were undermined and crossclamped using curved Heaney clamps. The pedicles were transected and doubly ligated. First, the  ligation was a free tie with the second tie being a stick tie. The ureters were out of the operative field. Next, the cardinal broad ligament complexes were skeletonized to expose the uterine arteries. The uterine arteries were doubly clamped, cut, and stick tied using 0 Vicryl suture. The remainder of the cardinal broad ligament complexes was then taken down doing a clamping, cutting, and stick tying technique down to the level of the cervicovaginal junction. At this point, the specimen was removed from the operative field. The angles of the vagina were closed using a Richardson stitch of 0 Vicryl suture. Figure-of-eight sutures were then placed in between the angles to close the vaginal cuff. Irrigation was performed. Hemostasis was noted to be intact. The abdomen was then closed in layers with 0 Maxon being used on the fascia in a simple running manner. These were closed in the midline. The skin was closed with staples. A Lidoderm patch was applied and pressure dressing was placed. The patient was then awakened, extubated, and taken to the recovery room in satisfactory condition. Estimated blood loss was 50 mL. Urine output was 50 mL. IV fluids were 1800 mL. All instruments, needles, and sponge counts were verified as correct. The patient did receive Ancef antibiotic prophylaxis.    ____________________________ Alanda Slim Lional Icenogle, MD mad:dd D: 05/06/2014 15:12:43 ET T: 05/07/2014 01:58:14 ET JOB#: [id number]  cc: Hassell Done A. Tamikka Pilger, MD, <Dictator> [facility] Alanda Slim Gautam Langhorst MD ELECTRONICALLY SIGNED 05/07/2014 13:28

## 2015-04-21 ENCOUNTER — Ambulatory Visit: Payer: BC Managed Care – PPO | Admitting: Endocrinology

## 2015-07-02 ENCOUNTER — Ambulatory Visit
Admission: RE | Admit: 2015-07-02 | Discharge: 2015-07-02 | Disposition: A | Discharge status: Home / Self Care | Payer: BLUE CROSS/BLUE SHIELD | Source: Ambulatory Visit | Attending: Unknown Physician Specialty | Admitting: Unknown Physician Specialty

## 2015-07-02 DIAGNOSIS — E041 Nontoxic single thyroid nodule: Secondary | ICD-10-CM | POA: Insufficient documentation

## 2015-07-15 ENCOUNTER — Other Ambulatory Visit: Payer: Self-pay | Admitting: Unknown Physician Specialty

## 2015-07-15 DIAGNOSIS — E041 Nontoxic single thyroid nodule: Secondary | ICD-10-CM

## 2015-07-20 ENCOUNTER — Other Ambulatory Visit: Payer: Self-pay | Admitting: Obstetrics and Gynecology

## 2015-09-23 ENCOUNTER — Ambulatory Visit (INDEPENDENT_AMBULATORY_CARE_PROVIDER_SITE_OTHER): Payer: BLUE CROSS/BLUE SHIELD | Admitting: Internal Medicine

## 2015-09-23 ENCOUNTER — Encounter: Payer: Self-pay | Admitting: Internal Medicine

## 2015-09-23 VITALS — BP 100/70 | HR 64 | Temp 98.1°F | Resp 18 | Ht 64.5 in | Wt 144.0 lb

## 2015-09-23 DIAGNOSIS — K5900 Constipation, unspecified: Secondary | ICD-10-CM | POA: Diagnosis not present

## 2015-09-23 DIAGNOSIS — E041 Nontoxic single thyroid nodule: Secondary | ICD-10-CM

## 2015-09-23 DIAGNOSIS — R194 Change in bowel habit: Secondary | ICD-10-CM

## 2015-09-23 NOTE — Progress Notes (Signed)
**Note De-identified [place] Obfuscation** Pre-visit discussion using our clinic review tool. No additional management support is needed unless otherwise documented below in the visit note.  

## 2015-09-23 NOTE — Progress Notes (Signed)
**Note De-Identified [place] Obfuscation** Patient ID: Sandy Jefferson, female   DOB: 1981-05-16, 34 y.o.   MRN: [id number]   Subjective:    Patient ID: Sandy Munson Lichter, female    DOB: 01/26/1981, 23 y.o.   MRN: [id number]  HPI  Patient with past history of thyroid nodule.  Being followed currently.  Has seen endocrinology, surgery and ENT.  W/up unrevealing.  Being followed now.  She still reports some swallowing issues.  Some fullness. Saw ENT as outlined.  We discussed further f/up.  She wants to hold at this time.  She also reports some bowel issues.  States if she eats cashews - bowels flare.  Some constipation.  Some itching when flares.  No flares if avoids cashews.  Appetite is good.  No nausea or vomiting.     Past Medical History  Diagnosis Date  . Migraine headache   . Bladder troubles    Past Surgical History  Procedure Laterality Date  . Cesarean section    . Hysteroscopy    . Hernia repair     Family History  Problem Relation Age of Onset  . Heart disease Brother   . Hyperlipidemia Mother   . Hyperlipidemia Father   . Cancer Maternal Aunt     breast  . Stroke Maternal Grandmother   . Hyperlipidemia Maternal Grandfather   . Cancer Paternal Grandmother     lung  . Diabetes Paternal Grandmother   . Diabetes Paternal Grandfather   . Colon cancer      great grandmother   Social History   Social History  . Marital Status: Married    Spouse Name: N/A  . Number of Children: 4  . Years of Education: N/A   Social History Main Topics  . Smoking status: Never Smoker   . Smokeless tobacco: Never Used  . Alcohol Use: 0.0 oz/week    0 Standard drinks or equivalent per week     Comment: rarely  . Drug Use: No  . Sexual Activity: Not Asked   Other Topics Concern  . None   Social History Narrative    Outpatient Encounter Prescriptions as of 09/23/2015  Medication Sig  . estradiol (ESTRACE) 1 MG tablet TAKE 1 TABLET BY MOUTH EVERY DAY (Patient taking differently: TAKE 1/2 TABLET BY MOUTH EVERY DAY)  .  [DISCONTINUED] ESTROGENS CONJUGATED PO Take 0.5 mg by mouth.   No facility-administered encounter medications on file as of 09/23/2015.    Review of Systems  Constitutional: Negative for appetite change and unexpected weight change.  HENT: Negative for congestion and sinus pressure.   Respiratory: Negative for cough, chest tightness and shortness of breath.   Cardiovascular: Negative for chest pain, palpitations and leg swelling.  Gastrointestinal: Positive for constipation. Negative for abdominal pain.       Bowel flares as outlined.  If avoids cashews - better.    Genitourinary: Negative for dysuria and difficulty urinating.  Musculoskeletal: Negative for back pain and joint swelling.  Skin: Negative for color change and rash.  Neurological: Negative for dizziness, light-headedness and headaches.  Psychiatric/Behavioral: Negative for dysphoric mood and agitation.       Objective:    Physical Exam  Constitutional: She appears well-developed and well-nourished. No distress.  HENT:  Nose: Nose normal.  Mouth/Throat: Oropharynx is clear and moist.  Eyes: Conjunctivae are normal. Right eye exhibits discharge. Left eye exhibits no discharge.  Neck: Neck supple. No thyromegaly present.  Cardiovascular: Normal rate and regular rhythm.   Pulmonary/Chest: Breath sounds normal. No respiratory **Note De-Identified [place] Obfuscation** distress. She has no wheezes.  Abdominal: Soft. Bowel sounds are normal. There is no tenderness.  Musculoskeletal: She exhibits no edema or tenderness.  Lymphadenopathy:    She has no cervical adenopathy.  Skin: No rash noted. No erythema.  Psychiatric: She has a normal mood and affect. Her behavior is normal.    BP 100/70 mmHg  Pulse 64  Temp(Src) 98.1 F (36.7 C) (Oral)  Resp 18  Ht 5' 4.5" (1.638 m)  Wt 144 lb (65.318 kg)  BMI 24.34 kg/m2  SpO2 97% Wt Readings from Last 3 Encounters:  09/23/15 144 lb (65.318 kg)  12/03/14 147 lb 12.8 oz (67.042 kg)  10/08/14 141 lb (63.957 kg)      Lab Results  Component Value Date   WBC 7.0 09/23/2015   HGB 12.5 09/23/2015   HCT 37.5 09/23/2015   PLT 200.0 09/23/2015   GLUCOSE 86 09/23/2015   CHOL 177 01/29/2014   TRIG 60.0 01/29/2014   HDL 42.70 01/29/2014   LDLCALC 122* 01/29/2014   ALT 13 09/23/2015   AST 15 09/23/2015   NA 139 09/23/2015   K 4.0 09/23/2015   CL 103 09/23/2015   CREATININE 0.95 09/23/2015   BUN 28* 09/23/2015   CO2 30 09/23/2015   TSH 1.93 09/23/2015    US Soft Tissue Head/neck  07/03/2015  CLINICAL DATA:  Single thyroid nodule EXAM: THYROID ULTRASOUND TECHNIQUE: Ultrasound examination of the thyroid gland and adjacent soft tissues was performed. COMPARISON:  11/11/2014, 08/27/2014 FINDINGS: Right thyroid lobe Measurements: 4.4 x 1.3 x 1.7 cm. 1.1 cm hypoechoic nodule is again noted within the midportion of the right lobe of the thyroid. This has been previously biopsied. Left thyroid lobe Measurements: 4.0 x 1.5 x 1.6 cm.  No nodules visualized. Isthmus Thickness: 2 mm.  No nodules visualized. Lymphadenopathy None visualized. IMPRESSION: Stable right thyroid nodule.  No new focal abnormality is noted. Electronically Signed   By: Inez Catalina M.D.   On: 07/03/2015 10:01       Assessment & Plan:   Problem List Items Addressed This Visit    Change in bowel habits    Occurs with eating cashews.  Will avoid and follow bowel symptoms.  Check routine labs.  Follow.        Right thyroid nodule    Fullness on exam.  Saw endocrinology, surgery and ENT.  Biopsy - question of results per pt.  They have elected to follow.  Will notify me if desires any further w/up or evaluation.  Will with fullness.  Still has some issues swallowing.  Desires no further intervention at this time.  Check tsh.        Relevant Orders   TSH (Completed)    Other Visit Diagnoses    Constipation, unspecified constipation type    -  Primary    Relevant Orders    CBC with Differential/Platelet (Completed)    Comprehensive  metabolic panel (Completed)        Einar Pheasant, MD

## 2015-09-24 LAB — COMPREHENSIVE METABOLIC PANEL
ALBUMIN: 4.4 g/dL (ref 3.5–5.2)
ALT: 13 U/L (ref 0–35)
AST: 15 U/L (ref 0–37)
Alkaline Phosphatase: 39 U/L (ref 39–117)
BUN: 28 mg/dL — ABNORMAL HIGH (ref 6–23)
CALCIUM: 9.2 mg/dL (ref 8.4–10.5)
CO2: 30 meq/L (ref 19–32)
Chloride: 103 mEq/L (ref 96–112)
Creatinine, Ser: 0.95 mg/dL (ref 0.40–1.20)
GFR: 70.6 mL/min (ref >60.00)
GLUCOSE: 86 mg/dL (ref 70–99)
POTASSIUM: 4 meq/L (ref 3.5–5.1)
Sodium: 139 mEq/L (ref 135–145)
TOTAL PROTEIN: 7.2 g/dL (ref 6.0–8.3)
Total Bilirubin: 0.8 mg/dL (ref 0.2–1.2)

## 2015-09-24 LAB — CBC WITH DIFFERENTIAL/PLATELET
BASOS PCT: 0.6 % (ref 0.0–3.0)
Basophils Absolute: 0 10*3/uL (ref 0.0–0.1)
EOS PCT: 4.6 % (ref 0.0–5.0)
Eosinophils Absolute: 0.3 10*3/uL (ref 0.0–0.7)
HEMATOCRIT: 37.5 % (ref 36.0–46.0)
HEMOGLOBIN: 12.5 g/dL (ref 12.0–15.0)
LYMPHS PCT: 32.2 % (ref 12.0–46.0)
Lymphs Abs: 2.3 10*3/uL (ref 0.7–4.0)
MCHC: 33.2 g/dL (ref 30.0–36.0)
MCV: 90.7 fl (ref 78.0–100.0)
Monocytes Absolute: 0.4 10*3/uL (ref 0.1–1.0)
Monocytes Relative: 6.4 % (ref 3.0–12.0)
NEUTROS ABS: 4 10*3/uL (ref 1.4–7.7)
Neutrophils Relative %: 56.2 % (ref 43.0–77.0)
PLATELETS: 200 10*3/uL (ref 150.0–400.0)
RBC: 4.14 Mil/uL (ref 3.87–5.11)
RDW: 13.4 % (ref 11.5–15.5)
WBC: 7 10*3/uL (ref 4.0–10.5)

## 2015-09-24 LAB — TSH: TSH: 1.93 u[IU]/mL (ref 0.35–4.50)

## 2015-09-25 ENCOUNTER — Encounter: Payer: Self-pay | Admitting: *Deleted

## 2015-09-28 ENCOUNTER — Encounter: Payer: Self-pay | Admitting: Internal Medicine

## 2015-09-28 DIAGNOSIS — R194 Change in bowel habit: Secondary | ICD-10-CM | POA: Insufficient documentation

## 2015-09-28 NOTE — Assessment & Plan Note (Signed)
**Note De-Identified [place] Obfuscation** Occurs with eating cashews.  Will avoid and follow bowel symptoms.  Check routine labs.  Follow.

## 2015-09-28 NOTE — Assessment & Plan Note (Signed)
**Note De-Identified [place] Obfuscation** Fullness on exam.  Saw endocrinology, surgery and ENT.  Biopsy - question of results per pt.  They have elected to follow.  Will notify me if desires any further w/up or evaluation.  Will with fullness.  Still has some issues swallowing.  Desires no further intervention at this time.  Check tsh.

## 2015-10-15 ENCOUNTER — Other Ambulatory Visit: Payer: Self-pay

## 2015-10-31 ENCOUNTER — Other Ambulatory Visit: Payer: Self-pay | Admitting: *Deleted

## 2015-10-31 NOTE — Telephone Encounter (Signed)
**Note De-Identified [place] Obfuscation** Okay to refill? Per Dr. Adriana Reams needs to be refilled by PCP now.

## 2015-11-01 NOTE — Telephone Encounter (Signed)
**Note De-Identified [place] Obfuscation** Please clarify with pt that this is the only medication that she is taking.  Also confirm that she has not had a hysterectomy.  Is she planning to see Dr Enzo Bi again?  If she is not planning to see him again and if has not had hysterectomy, then I will need to clarify with Dr Enzo Bi the medication and if needs anything more.

## 2015-11-03 MED ORDER — ESTRADIOL 1 MG PO TABS: ORAL_TABLET | ORAL | Status: DC

## 2015-11-03 NOTE — Telephone Encounter (Signed)
**Note De-Identified Code Obfuscation** ok'd refill estrogen #45 with one refill.

## 2015-11-03 NOTE — Telephone Encounter (Signed)
**Note De-Identified [place] Obfuscation** PT is only taking the Estradiol. She had a total hysterectomy in July 2016. She is no longer seeing Dr. Enzo Bi.

## 2015-11-25 ENCOUNTER — Ambulatory Visit (INDEPENDENT_AMBULATORY_CARE_PROVIDER_SITE_OTHER): Payer: BLUE CROSS/BLUE SHIELD | Admitting: Internal Medicine

## 2015-11-25 ENCOUNTER — Encounter: Payer: Self-pay | Admitting: Internal Medicine

## 2015-11-25 VITALS — BP 110/78 | HR 63 | Temp 98.3°F | Resp 18 | Ht 64.5 in | Wt 146.5 lb

## 2015-11-25 DIAGNOSIS — E2839 Other primary ovarian failure: Secondary | ICD-10-CM

## 2015-11-25 DIAGNOSIS — E041 Nontoxic single thyroid nodule: Secondary | ICD-10-CM | POA: Diagnosis not present

## 2015-11-25 DIAGNOSIS — R194 Change in bowel habit: Secondary | ICD-10-CM | POA: Diagnosis not present

## 2015-11-25 DIAGNOSIS — Z124 Encounter for screening for malignant neoplasm of cervix: Secondary | ICD-10-CM

## 2015-11-25 DIAGNOSIS — Z0000 Encounter for general adult medical examination without abnormal findings: Secondary | ICD-10-CM

## 2015-11-25 NOTE — Progress Notes (Signed)
**Note De-identified [place] Obfuscation** Pre-visit discussion using our clinic review tool. No additional management support is needed unless otherwise documented below in the visit note.  

## 2015-11-25 NOTE — Progress Notes (Addendum)
**Note De-Identified [place] Obfuscation** Patient ID: Sandy Jefferson, female   DOB: May 27, 1981, 35 y.o.   MRN: [id number]   Subjective:    Patient ID: Sandy Munson Breau, female    DOB: 1981-03-12, 35 y.o.   MRN: [id number]  HPI  Patient with past history of thyroid nodule and constipation who presents to follow up on these issues as well as for a complete physical exam.   Previously saw endocrinology, surgery and ENT.  S/p biopsy.  Pathology inconclusive.  She still describes a fullness in there throat.  Some discomfort when swallows - at times.  No sore throat.  Eating and drinking.  She is still noticing some bowel changes.  Off cashews.  She was concerned they were aggravating her stomach.  Still issues with her bowel.  Bowel change.  May go without having a bowel movement for 2-3 days and then will go all day.  Discussed referral to GI.     Past Medical History  Diagnosis Date  . Migraine headache   . Bladder troubles    Past Surgical History  Procedure Laterality Date  . Cesarean section    . Hysteroscopy    . Hernia repair     Family History  Problem Relation Age of Onset  . Heart disease Brother   . Hyperlipidemia Mother   . Hyperlipidemia Father   . Cancer Maternal Aunt     breast  . Stroke Maternal Grandmother   . Hyperlipidemia Maternal Grandfather   . Cancer Paternal Grandmother     lung  . Diabetes Paternal Grandmother   . Diabetes Paternal Grandfather   . Colon cancer      great grandmother   Social History   Social History  . Marital Status: Married    Spouse Name: N/A  . Number of Children: 4  . Years of Education: N/A   Social History Main Topics  . Smoking status: Never Smoker   . Smokeless tobacco: Never Used  . Alcohol Use: 0.0 oz/week    0 Standard drinks or equivalent per week     Comment: rarely  . Drug Use: No  . Sexual Activity: Not Asked   Other Topics Concern  . None   Social History Narrative    Outpatient Encounter Prescriptions as of 11/25/2015  Medication Sig  . estradiol  (ESTRACE) 1 MG tablet 1/2 tablet q day.   No facility-administered encounter medications on file as of 11/25/2015.    Review of Systems  Constitutional: Negative for appetite change and unexpected weight change.  HENT: Negative for congestion and sinus pressure.   Eyes: Negative for discharge and redness.  Respiratory: Negative for cough, chest tightness and shortness of breath.   Cardiovascular: Negative for chest pain, palpitations and leg swelling.  Gastrointestinal: Negative for nausea, vomiting, abdominal pain and diarrhea.       Bowel change as outlined.    Genitourinary: Negative for dysuria and difficulty urinating.  Musculoskeletal: Negative for back pain and joint swelling.  Neurological: Negative for dizziness, light-headedness and headaches.  Psychiatric/Behavioral: Negative for dysphoric mood and agitation.       Objective:    Physical Exam  Constitutional: She is oriented to person, place, and time. She appears well-developed and well-nourished. No distress.  HENT:  Nose: Nose normal.  Mouth/Throat: Oropharynx is clear and moist.  Eyes: Right eye exhibits no discharge. Left eye exhibits no discharge. No scleral icterus.  Neck: Neck supple. No thyromegaly present.  Minimal fullness - right neck.    Cardiovascular: Normal rate **Note De-Identified Serio Obfuscation** and regular rhythm.   Pulmonary/Chest: Breath sounds normal. No accessory muscle usage. No tachypnea. No respiratory distress. She has no decreased breath sounds. She has no wheezes. She has no rhonchi. Right breast exhibits no inverted nipple, no mass, no nipple discharge and no tenderness (no axillary adenopathy). Left breast exhibits no inverted nipple, no mass, no nipple discharge and no tenderness (no axilarry adenopathy).  Abdominal: Soft. Bowel sounds are normal. There is no tenderness.  Musculoskeletal: She exhibits no edema or tenderness.  Lymphadenopathy:    She has no cervical adenopathy.  Neurological: She is alert and oriented to  person, place, and time.  Skin: Skin is warm. No rash noted. No erythema.  Psychiatric: She has a normal mood and affect. Her behavior is normal.    BP 110/78 mmHg  Pulse 63  Temp(Src) 98.3 F (36.8 C) (Oral)  Resp 18  Ht 5' 4.5" (1.638 m)  Wt 146 lb 8 oz (66.452 kg)  BMI 24.77 kg/m2  SpO2 97%  LMP  Wt Readings from Last 3 Encounters:  11/25/15 146 lb 8 oz (66.452 kg)  09/23/15 144 lb (65.318 kg)  12/03/14 147 lb 12.8 oz (67.042 kg)     Lab Results  Component Value Date   WBC 7.0 09/23/2015   HGB 12.5 09/23/2015   HCT 37.5 09/23/2015   PLT 200.0 09/23/2015   GLUCOSE 86 09/23/2015   CHOL 177 01/29/2014   TRIG 60.0 01/29/2014   HDL 42.70 01/29/2014   LDLCALC 122* 01/29/2014   ALT 13 09/23/2015   AST 15 09/23/2015   NA 139 09/23/2015   K 4.0 09/23/2015   CL 103 09/23/2015   CREATININE 0.95 09/23/2015   BUN 28* 09/23/2015   CO2 30 09/23/2015   TSH 1.93 09/23/2015    US Soft Tissue Head/neck  07/03/2015  CLINICAL DATA:  Single thyroid nodule EXAM: THYROID ULTRASOUND TECHNIQUE: Ultrasound examination of the thyroid gland and adjacent soft tissues was performed. COMPARISON:  11/11/2014, 08/27/2014 FINDINGS: Right thyroid lobe Measurements: 4.4 x 1.3 x 1.7 cm. 1.1 cm hypoechoic nodule is again noted within the midportion of the right lobe of the thyroid. This has been previously biopsied. Left thyroid lobe Measurements: 4.0 x 1.5 x 1.6 cm.  No nodules visualized. Isthmus Thickness: 2 mm.  No nodules visualized. Lymphadenopathy None visualized. IMPRESSION: Stable right thyroid nodule.  No new focal abnormality is noted. Electronically Signed   By: Inez Catalina M.D.   On: 07/03/2015 10:01       Assessment & Plan:   Problem List Items Addressed This Visit    Change in bowel habits    Change in bowel issues as outlined.  She thought was related to cashews.  Has stopped cashews.  Still with bowel changes as outlined.  Discussed probiotic.  Refer to GI.        Estrogen  deficiency    Is s/p TAH/BSO.  On estrogen.  Follow.        Health care maintenance    Physical today.  Discussed mammogram.  Will let me know when agreeable.        Right thyroid nodule    Fullness noted on exam.  Saw endocrinology, surgery and ENT.  Biopsy inconclusive.  Discussed with her today.  Refer to Dr Harlow Asa for evaluation.         Other Visit Diagnoses    Pap smear for cervical cancer screening    -  Primary      I spent 25 minutes with **Note De-Identified [place] Obfuscation** the patient and more than 50% of the time was spent in consultation regarding the above.     Einar Pheasant, MD

## 2015-11-30 ENCOUNTER — Encounter: Payer: Self-pay | Admitting: Internal Medicine

## 2015-11-30 DIAGNOSIS — Z0000 Encounter for general adult medical examination without abnormal findings: Secondary | ICD-10-CM | POA: Insufficient documentation

## 2015-11-30 NOTE — Assessment & Plan Note (Signed)
**Note De-Identified [place] Obfuscation** Fullness noted on exam.  Saw endocrinology, surgery and ENT.  Biopsy inconclusive.  Discussed with her today.  Refer to Dr Harlow Asa for evaluation.

## 2015-11-30 NOTE — Addendum Note (Signed)
**Note De-Identified [place] Obfuscation** Addended by: Alisa Graff on: 11/30/2015 09:10 PM   Modules accepted: Miquel Dunn

## 2015-11-30 NOTE — Assessment & Plan Note (Signed)
**Note De-Identified [place]re[place] Obfuscation** Physical today.  Discussed mammogram.  Will let me know when agreeable.

## 2015-11-30 NOTE — Assessment & Plan Note (Signed)
**Note De-Identified [place] Obfuscation** Is s/p TAH/BSO.  On estrogen.  Follow.

## 2015-11-30 NOTE — Assessment & Plan Note (Signed)
**Note De-Identified Kin[place] Obfuscation** Change in bowel issues as outlined.  She thought was related to cashews.  Has stopped cashews.  Still with bowel changes as outlined.  Discussed probiotic.  Refer to GI.

## 2015-12-22 ENCOUNTER — Telehealth: Payer: Self-pay | Admitting: Internal Medicine

## 2015-12-22 DIAGNOSIS — E041 Nontoxic single thyroid nodule: Secondary | ICD-10-CM

## 2015-12-22 NOTE — Telephone Encounter (Signed)
**Note De-Identified [place] Obfuscation** Pt called to check the status of her referral regarding her Thyroid. Pt states it was mentioned in January. Call pt @ [phone]. Thank you!

## 2015-12-22 NOTE — Telephone Encounter (Signed)
**Note De-Identified [place] Obfuscation** Pt is reuesting a referral for endocrine because of thyroid. Please advise, thanks

## 2015-12-23 NOTE — Telephone Encounter (Signed)
**Note De-Identified Sarno Obfuscation** Referral has been sent to [facility].

## 2015-12-23 NOTE — Telephone Encounter (Signed)
**Note De-Identified Grizzle Obfuscation** Order placed for referral to general surgery.  Please schedule an appt with Dr Armandina Gemma for evaluation of thyroid nodule.

## 2016-02-05 DIAGNOSIS — E041 Nontoxic single thyroid nodule: Secondary | ICD-10-CM | POA: Diagnosis not present

## 2016-02-05 DIAGNOSIS — D440 Neoplasm of uncertain behavior of thyroid gland: Secondary | ICD-10-CM | POA: Diagnosis not present

## 2016-02-23 ENCOUNTER — Ambulatory Visit (INDEPENDENT_AMBULATORY_CARE_PROVIDER_SITE_OTHER): Payer: BLUE CROSS/BLUE SHIELD | Admitting: Internal Medicine

## 2016-02-23 ENCOUNTER — Encounter: Payer: Self-pay | Admitting: Internal Medicine

## 2016-02-23 VITALS — BP 92/60 | HR 67 | Temp 98.4°F | Resp 18 | Ht 64.5 in | Wt 147.8 lb

## 2016-02-23 DIAGNOSIS — R5383 Other fatigue: Secondary | ICD-10-CM

## 2016-02-23 DIAGNOSIS — M2550 Pain in unspecified joint: Secondary | ICD-10-CM | POA: Diagnosis not present

## 2016-02-23 DIAGNOSIS — E2839 Other primary ovarian failure: Secondary | ICD-10-CM

## 2016-02-23 DIAGNOSIS — E041 Nontoxic single thyroid nodule: Secondary | ICD-10-CM | POA: Diagnosis not present

## 2016-02-23 MED ORDER — ESTRADIOL 1 MG PO TABS: 1.0000 mg | ORAL_TABLET | Freq: Every day | ORAL | Status: DC

## 2016-02-23 NOTE — Progress Notes (Signed)
**Note De-identified Warning Obfuscation** Pre-visit discussion using our clinic review tool. No additional management support is needed unless otherwise documented below in the visit note.  

## 2016-02-23 NOTE — Progress Notes (Signed)
**Note De-Identified Dillon Obfuscation** Patient ID: Sandy Jefferson, female   DOB: Nov 21, 1980, 35 y.o.   MRN: [id number]   Subjective:    Patient ID: Sandy Jefferson, female    DOB: [birth year], 35 y.o.   MRN: [id number]  HPI  Patient here for a scheduled follow up.  She has recently seen Dr Harlow Asa for f/u of right thyroid nodule.  Pathology revealed cytologic atypia.  He recommended a repeat biopsy with molecular genetic testing.  Plans to schedule this in the near future.  She still reports increased fatigue.  She increased her estrace to 71m per day.  Feels some better since doing this.  She is resting better since adjusting the dose.  She reports some increased swelling and some aching of her bones.  No rash.  No fever.  No headache.  Bowels better.  No increased abdominal pain or cramping.    Past Medical History  Diagnosis Date  . Migraine headache   . Bladder troubles    Past Surgical History  Procedure Laterality Date  . Cesarean section    . Hysteroscopy    . Hernia repair     Family History  Problem Relation Age of Onset  . Heart disease Brother   . Hyperlipidemia Mother   . Hyperlipidemia Father   . Cancer Maternal Aunt     breast  . Stroke Maternal Grandmother   . Hyperlipidemia Maternal Grandfather   . Cancer Paternal Grandmother     lung  . Diabetes Paternal Grandmother   . Diabetes Paternal Grandfather   . Colon cancer      great grandmother   Social History   Social History  . Marital Status: Married    Spouse Name: N/A  . Number of Children: 4  . Years of Education: N/A   Social History Main Topics  . Smoking status: Never Smoker   . Smokeless tobacco: Never Used  . Alcohol Use: 0.0 oz/week    0 Standard drinks or equivalent per week     Comment: rarely  . Drug Use: No  . Sexual Activity: Not Asked   Other Topics Concern  . None   Social History Narrative    Outpatient Encounter Prescriptions as of 02/23/2016  Medication Sig  . estradiol (ESTRACE) 1 MG tablet Take 1 tablet (1 mg total) by  mouth daily.  . [DISCONTINUED] estradiol (ESTRACE) 1 MG tablet 1/2 tablet q day. (Patient taking differently: Take 1 mg by mouth daily. 1/2 tablet q day.)   No facility-administered encounter medications on file as of 02/23/2016.    Review of Systems  Constitutional: Positive for fatigue. Negative for appetite change.  HENT: Negative for congestion and sinus pressure.   Respiratory: Negative for cough, chest tightness and shortness of breath.   Cardiovascular: Negative for chest pain, palpitations and leg swelling.  Gastrointestinal: Negative for nausea, vomiting, abdominal pain and diarrhea.  Genitourinary: Negative for dysuria and difficulty urinating.  Musculoskeletal: Negative for back pain.       Describes some aching in her bones.   Skin: Negative for color change and rash.  Neurological: Negative for dizziness, light-headedness and headaches.  Psychiatric/Behavioral: Positive for sleep disturbance. Negative for dysphoric mood and agitation.       Objective:    Physical Exam  Constitutional: She appears well-developed and well-nourished. No distress.  HENT:  Nose: Nose normal.  Mouth/Throat: Oropharynx is clear and moist.  Neck: Neck supple.  Fullness - right neck  Cardiovascular: Normal rate and regular rhythm.   Pulmonary/Chest: Breath **Note De-Identified Arkwright Obfuscation** sounds normal. No respiratory distress. She has no wheezes.  Abdominal: Soft. Bowel sounds are normal. There is no tenderness.  Musculoskeletal: She exhibits no edema or tenderness.  Lymphadenopathy:    She has no cervical adenopathy.  Skin: No rash noted. No erythema.  Psychiatric: She has a normal mood and affect. Her behavior is normal.    BP 92/60 mmHg  Pulse 67  Temp(Src) 98.4 F (36.9 C) (Oral)  Resp 18  Ht 5' 4.5" (1.638 m)  Wt 147 lb 12 oz (67.019 kg)  BMI 24.98 kg/m2  SpO2 99% Wt Readings from Last 3 Encounters:  02/23/16 147 lb 12 oz (67.019 kg)  11/25/15 146 lb 8 oz (66.452 kg)  09/23/15 144 lb (65.318 kg)      Lab Results  Component Value Date   WBC 6.2 02/23/2016   HGB 11.5* 02/23/2016   HCT 33.4* 02/23/2016   PLT 197.0 02/23/2016   GLUCOSE 93 02/23/2016   CHOL 177 01/29/2014   TRIG 60.0 01/29/2014   HDL 42.70 01/29/2014   LDLCALC 122* 01/29/2014   ALT 9 02/23/2016   AST 10 02/23/2016   NA 139 02/23/2016   K 4.1 02/23/2016   CL 105 02/23/2016   CREATININE 0.78 02/23/2016   BUN 17 02/23/2016   CO2 28 02/23/2016   TSH 8.87* 02/23/2016    US Soft Tissue Head/neck  07/03/2015  CLINICAL DATA:  Single thyroid nodule EXAM: THYROID ULTRASOUND TECHNIQUE: Ultrasound examination of the thyroid gland and adjacent soft tissues was performed. COMPARISON:  11/11/2014, 08/27/2014 FINDINGS: Right thyroid lobe Measurements: 4.4 x 1.3 x 1.7 cm. 1.1 cm hypoechoic nodule is again noted within the midportion of the right lobe of the thyroid. This has been previously biopsied. Left thyroid lobe Measurements: 4.0 x 1.5 x 1.6 cm.  No nodules visualized. Isthmus Thickness: 2 mm.  No nodules visualized. Lymphadenopathy None visualized. IMPRESSION: Stable right thyroid nodule.  No new focal abnormality is noted. Electronically Signed   By: Inez Catalina M.D.   On: 07/03/2015 10:01       Assessment & Plan:   Problem List Items Addressed This Visit    Estrogen deficiency    Is s/p TAH/BSO.  Started on estrogen by gyn.  She recently increased dose.  She reports feeling some better.  Follow.       Fatigue    Does feel some better since adjusting estrogen.  Check thyroid function tests and routine labs plus B12.        Relevant Orders   CBC with Differential/Platelet (Completed)   Vitamin B12 (Completed)   Basic metabolic panel (Completed)   Hepatic function panel (Completed)   Joint ache    Will check esr, RF, CRP, ANA and B12 given persistent symptoms.  Further w/up pending results.        Relevant Orders   Rheumatoid factor (Completed)   ANA (Completed)   C-reactive protein (Completed)    Sedimentation rate (Completed)   Right thyroid nodule - Primary    Right thyroid nodule with pathology revealing cytologic atypia.  Planning for f/u biopsy with genetic testing.  Continue f/u with Dr Harlow Asa.       Relevant Orders   TSH (Completed)   T3, free (Completed)   T4, free (Completed)       Einar Pheasant, MD

## 2016-02-24 ENCOUNTER — Other Ambulatory Visit: Payer: Self-pay | Admitting: Internal Medicine

## 2016-02-24 DIAGNOSIS — D649 Anemia, unspecified: Secondary | ICD-10-CM

## 2016-02-24 DIAGNOSIS — E039 Hypothyroidism, unspecified: Secondary | ICD-10-CM

## 2016-02-24 LAB — BASIC METABOLIC PANEL
BUN: 17 mg/dL (ref 6–23)
CHLORIDE: 105 meq/L (ref 96–112)
CO2: 28 meq/L (ref 19–32)
CREATININE: 0.78 mg/dL (ref 0.40–1.20)
Calcium: 9 mg/dL (ref 8.4–10.5)
GFR: 88.43 mL/min (ref >60.00)
Glucose, Bld: 93 mg/dL (ref 70–99)
Potassium: 4.1 mEq/L (ref 3.5–5.1)
Sodium: 139 mEq/L (ref 135–145)

## 2016-02-24 LAB — TSH: TSH: 8.87 u[IU]/mL — ABNORMAL HIGH (ref 0.35–4.50)

## 2016-02-24 LAB — CBC WITH DIFFERENTIAL/PLATELET
BASOS ABS: 0.1 10*3/uL (ref 0.0–0.1)
Basophils Relative: 1.1 % (ref 0.0–3.0)
EOS ABS: 0.2 10*3/uL (ref 0.0–0.7)
Eosinophils Relative: 3.7 % (ref 0.0–5.0)
HEMATOCRIT: 33.4 % — AB (ref 36.0–46.0)
HEMOGLOBIN: 11.5 g/dL — AB (ref 12.0–15.0)
LYMPHS ABS: 2.3 10*3/uL (ref 0.7–4.0)
Lymphocytes Relative: 37.8 % (ref 12.0–46.0)
MCHC: 34.3 g/dL (ref 30.0–36.0)
MCV: 89.8 fl (ref 78.0–100.0)
Monocytes Absolute: 0.3 10*3/uL (ref 0.1–1.0)
Monocytes Relative: 5.5 % (ref 3.0–12.0)
NEUTROS ABS: 3.2 10*3/uL (ref 1.4–7.7)
Neutrophils Relative %: 51.9 % (ref 43.0–77.0)
Platelets: 197 10*3/uL (ref 150.0–400.0)
RBC: 3.72 Mil/uL — AB (ref 3.87–5.11)
RDW: 13.3 % (ref 11.5–15.5)
WBC: 6.2 10*3/uL (ref 4.0–10.5)

## 2016-02-24 LAB — T4, FREE: FREE T4: 0.62 ng/dL (ref 0.60–1.60)

## 2016-02-24 LAB — RHEUMATOID FACTOR: Rhuematoid fact SerPl-aCnc: <10 IU/mL (ref <=14)

## 2016-02-24 LAB — HEPATIC FUNCTION PANEL
ALT: 9 U/L (ref 0–35)
AST: 10 U/L (ref 0–37)
Albumin: 4.3 g/dL (ref 3.5–5.2)
Alkaline Phosphatase: 39 U/L (ref 39–117)
BILIRUBIN DIRECT: 0.1 mg/dL (ref 0.0–0.3)
TOTAL PROTEIN: 6.5 g/dL (ref 6.0–8.3)
Total Bilirubin: 0.7 mg/dL (ref 0.2–1.2)

## 2016-02-24 LAB — SEDIMENTATION RATE: Sed Rate: 11 mm/hr (ref 0–22)

## 2016-02-24 LAB — T3, FREE: T3, Free: 3.3 pg/mL (ref 2.3–4.2)

## 2016-02-24 LAB — C-REACTIVE PROTEIN: CRP: 0.2 mg/dL — ABNORMAL LOW (ref 0.5–20.0)

## 2016-02-24 LAB — ANA: ANA: NEGATIVE

## 2016-02-24 LAB — VITAMIN B12: VITAMIN B 12: 178 pg/mL — AB (ref 211–911)

## 2016-02-24 NOTE — Progress Notes (Signed)
**Note De-identified Blankenship Obfuscation** Order placed for f/u labs.  

## 2016-02-26 ENCOUNTER — Other Ambulatory Visit: Payer: Self-pay | Admitting: *Deleted

## 2016-02-26 MED ORDER — LEVOTHYROXINE SODIUM 50 MCG PO TABS: 50.0000 ug | ORAL_TABLET | Freq: Every day | ORAL | Status: DC

## 2016-02-26 NOTE — Telephone Encounter (Signed)
**Note De-Identified Dobbins Obfuscation** Sent in 45 tablets of Synthroid 50 mcg daily to [facility] in [place]. Pt notified

## 2016-03-01 ENCOUNTER — Encounter: Payer: Self-pay | Admitting: Internal Medicine

## 2016-03-01 NOTE — Assessment & Plan Note (Signed)
**Note De-Identified Nusz Obfuscation** Is s/p TAH/BSO.  Started on estrogen by gyn.  She recently increased dose.  She reports feeling some better.  Follow.

## 2016-03-01 NOTE — Assessment & Plan Note (Signed)
**Note De-Identified Valbuena Obfuscation** Right thyroid nodule with pathology revealing cytologic atypia.  Planning for f/u biopsy with genetic testing.  Continue f/u with Dr Harlow Asa.

## 2016-03-01 NOTE — Assessment & Plan Note (Signed)
**Note De-Identified Messler Obfuscation** Does feel some better since adjusting estrogen.  Check thyroid function tests and routine labs plus B12.

## 2016-03-01 NOTE — Assessment & Plan Note (Signed)
**Note De-Identified Delpizzo Obfuscation** Will check esr, RF, CRP, ANA and B12 given persistent symptoms.  Further w/up pending results.

## 2016-03-02 ENCOUNTER — Ambulatory Visit (INDEPENDENT_AMBULATORY_CARE_PROVIDER_SITE_OTHER): Payer: BLUE CROSS/BLUE SHIELD

## 2016-03-02 DIAGNOSIS — E538 Deficiency of other specified B group vitamins: Secondary | ICD-10-CM | POA: Diagnosis not present

## 2016-03-02 MED ORDER — CYANOCOBALAMIN 1000 MCG/ML IJ SOLN
1000.0000 ug | Freq: Once | INTRAMUSCULAR | Status: AC
  Administered 2016-03-02: 1000 ug via INTRAMUSCULAR

## 2016-03-02 NOTE — Progress Notes (Signed)
**Note De-Identified Gulbranson Obfuscation** Patient presented for 1st out of 4 B12 injection, per Dr. Bary Leriche order.  Administered R deltoid.  Tolerated well.

## 2016-03-09 ENCOUNTER — Ambulatory Visit (INDEPENDENT_AMBULATORY_CARE_PROVIDER_SITE_OTHER): Payer: BLUE CROSS/BLUE SHIELD | Admitting: Surgical

## 2016-03-09 DIAGNOSIS — E538 Deficiency of other specified B group vitamins: Secondary | ICD-10-CM

## 2016-03-09 MED ORDER — CYANOCOBALAMIN 1000 MCG/ML IJ SOLN
1000.0000 ug | Freq: Once | INTRAMUSCULAR | Status: AC
  Administered 2016-03-09: 1000 ug via INTRAMUSCULAR

## 2016-03-09 NOTE — Progress Notes (Signed)
**Note De-identified Dudenhoeffer Obfuscation** B12 given in left deltoid. Patient tolerated well.

## 2016-03-16 ENCOUNTER — Ambulatory Visit (INDEPENDENT_AMBULATORY_CARE_PROVIDER_SITE_OTHER): Payer: BLUE CROSS/BLUE SHIELD

## 2016-03-16 DIAGNOSIS — E538 Deficiency of other specified B group vitamins: Secondary | ICD-10-CM

## 2016-03-16 MED ORDER — CYANOCOBALAMIN 1000 MCG/ML IJ SOLN
1000.0000 ug | Freq: Once | INTRAMUSCULAR | Status: AC
  Administered 2016-03-16: 1000 ug via INTRAMUSCULAR

## 2016-03-16 NOTE — Progress Notes (Signed)
**Note De-Identified Zubiate Obfuscation** Patient presented for #3/4 weekly B12 injection, per Dr. Bary Leriche order.  Requested L deltoid administration due to R arm feeling sore with intermittent itching.  Tolerated well.

## 2016-03-21 ENCOUNTER — Other Ambulatory Visit: Payer: Self-pay | Admitting: Internal Medicine

## 2016-03-23 ENCOUNTER — Ambulatory Visit (INDEPENDENT_AMBULATORY_CARE_PROVIDER_SITE_OTHER): Payer: BLUE CROSS/BLUE SHIELD | Admitting: *Deleted

## 2016-03-23 DIAGNOSIS — E538 Deficiency of other specified B group vitamins: Secondary | ICD-10-CM

## 2016-03-23 MED ORDER — CYANOCOBALAMIN 1000 MCG/ML IJ SOLN
1000.0000 ug | Freq: Once | INTRAMUSCULAR | Status: AC
  Administered 2016-03-23: 1000 ug via INTRAMUSCULAR

## 2016-04-07 ENCOUNTER — Other Ambulatory Visit (INDEPENDENT_AMBULATORY_CARE_PROVIDER_SITE_OTHER): Payer: BLUE CROSS/BLUE SHIELD

## 2016-04-07 DIAGNOSIS — D649 Anemia, unspecified: Secondary | ICD-10-CM

## 2016-04-07 DIAGNOSIS — E039 Hypothyroidism, unspecified: Secondary | ICD-10-CM | POA: Diagnosis not present

## 2016-04-07 LAB — IBC PANEL
Iron: 156 ug/dL — ABNORMAL HIGH (ref 42–145)
SATURATION RATIOS: 49.3 % (ref 20.0–50.0)
Transferrin: 226 mg/dL (ref 212.0–360.0)

## 2016-04-07 LAB — CBC WITH DIFFERENTIAL/PLATELET
Basophils Absolute: 0 10*3/uL (ref 0.0–0.1)
Basophils Relative: 0.7 % (ref 0.0–3.0)
EOS PCT: 1.9 % (ref 0.0–5.0)
Eosinophils Absolute: 0.1 10*3/uL (ref 0.0–0.7)
HCT: 37.5 % (ref 36.0–46.0)
HEMOGLOBIN: 12.7 g/dL (ref 12.0–15.0)
LYMPHS ABS: 2.4 10*3/uL (ref 0.7–4.0)
Lymphocytes Relative: 37.9 % (ref 12.0–46.0)
MCHC: 33.8 g/dL (ref 30.0–36.0)
MCV: 89 fl (ref 78.0–100.0)
MONOS PCT: 5.4 % (ref 3.0–12.0)
Monocytes Absolute: 0.3 10*3/uL (ref 0.1–1.0)
Neutro Abs: 3.4 10*3/uL (ref 1.4–7.7)
Neutrophils Relative %: 54.1 % (ref 43.0–77.0)
Platelets: 208 10*3/uL (ref 150.0–400.0)
RBC: 4.21 Mil/uL (ref 3.87–5.11)
RDW: 12.6 % (ref 11.5–15.5)
WBC: 6.3 10*3/uL (ref 4.0–10.5)

## 2016-04-07 LAB — TSH: TSH: 1.53 u[IU]/mL (ref 0.35–4.50)

## 2016-04-07 LAB — FERRITIN: Ferritin: 77.8 ng/mL (ref 10.0–291.0)

## 2016-04-08 ENCOUNTER — Other Ambulatory Visit: Payer: BLUE CROSS/BLUE SHIELD

## 2016-04-21 ENCOUNTER — Ambulatory Visit (INDEPENDENT_AMBULATORY_CARE_PROVIDER_SITE_OTHER): Payer: BLUE CROSS/BLUE SHIELD | Admitting: Internal Medicine

## 2016-04-21 ENCOUNTER — Encounter: Payer: Self-pay | Admitting: Internal Medicine

## 2016-04-21 VITALS — BP 110/70 | HR 71 | Temp 97.6°F | Resp 18 | Ht 64.5 in | Wt 145.4 lb

## 2016-04-21 DIAGNOSIS — E041 Nontoxic single thyroid nodule: Secondary | ICD-10-CM

## 2016-04-21 DIAGNOSIS — E538 Deficiency of other specified B group vitamins: Secondary | ICD-10-CM

## 2016-04-21 DIAGNOSIS — R5383 Other fatigue: Secondary | ICD-10-CM

## 2016-04-21 DIAGNOSIS — E039 Hypothyroidism, unspecified: Secondary | ICD-10-CM

## 2016-04-21 DIAGNOSIS — R194 Change in bowel habit: Secondary | ICD-10-CM

## 2016-04-21 MED ORDER — LEVOTHYROXINE SODIUM 50 MCG PO TABS: ORAL_TABLET | ORAL | Status: DC

## 2016-04-21 NOTE — Progress Notes (Signed)
**Note De-identified Mcpeters Obfuscation** Pre-visit discussion using our clinic review tool. No additional management support is needed unless otherwise documented below in the visit note.  

## 2016-04-21 NOTE — Progress Notes (Signed)
**Note De-Identified Pullin Obfuscation** Patient ID: Gwynneth Munson Dapper, female   DOB: [birth year], 35 y.o.   MRN: [id number]   Subjective:    Patient ID: Gwynneth Munson Malerba, female    DOB: 1980/11/11, 41 y.o.   MRN: [id number]  HPI  Patient here for a scheduled follow up.  She does feel better. Taking B12 now.  Also on synthroid.  Bowels are better.  Stays active.  No sob.  Eating and drinking ok.  Still with some fatigue.  Does feel better.  No nausea or vomiting.     Past Medical History  Diagnosis Date  . Migraine headache   . Bladder troubles    Past Surgical History  Procedure Laterality Date  . Cesarean section    . Hysteroscopy    . Hernia repair     Family History  Problem Relation Age of Onset  . Heart disease Brother   . Hyperlipidemia Mother   . Hyperlipidemia Father   . Cancer Maternal Aunt     breast  . Stroke Maternal Grandmother   . Hyperlipidemia Maternal Grandfather   . Cancer Paternal Grandmother     lung  . Diabetes Paternal Grandmother   . Diabetes Paternal Grandfather   . Colon cancer      great grandmother   Social History   Social History  . Marital Status: Married    Spouse Name: N/A  . Number of Children: 4  . Years of Education: N/A   Social History Main Topics  . Smoking status: Never Smoker   . Smokeless tobacco: Never Used  . Alcohol Use: 0.0 oz/week    0 Standard drinks or equivalent per week     Comment: rarely  . Drug Use: No  . Sexual Activity: Not Asked   Other Topics Concern  . None   Social History Narrative    Outpatient Encounter Prescriptions as of 04/21/2016  Medication Sig  . estradiol (ESTRACE) 1 MG tablet Take 1 tablet (1 mg total) by mouth daily.  Marland Kitchen levothyroxine (SYNTHROID, LEVOTHROID) 50 MCG tablet TAKE 1 TABLET BY MOUTH EVERY DAY IN THE MORNING ON AN EMPTY STOMACH  . [DISCONTINUED] levothyroxine (SYNTHROID, LEVOTHROID) 50 MCG tablet TAKE 1 TABLET BY MOUTH EVERY DAY IN THE MORNING ON AN EMPTY STOMACH   No facility-administered encounter medications on file as  of 04/21/2016.    Review of Systems  Constitutional: Negative for appetite change and unexpected weight change.  HENT: Negative for congestion and sinus pressure.   Respiratory: Negative for cough, chest tightness and shortness of breath.   Cardiovascular: Negative for chest pain, palpitations and leg swelling.  Gastrointestinal: Negative for nausea, vomiting, abdominal pain and diarrhea.  Genitourinary: Negative for dysuria and difficulty urinating.  Musculoskeletal: Negative for back pain and joint swelling.  Skin: Negative for color change and rash.  Neurological: Negative for dizziness, light-headedness and headaches.  Psychiatric/Behavioral: Negative for dysphoric mood and agitation.       Objective:    Physical Exam  Constitutional: She appears well-developed and well-nourished. No distress.  HENT:  Nose: Nose normal.  Mouth/Throat: Oropharynx is clear and moist.  Neck: Neck supple. No thyromegaly present.  Cardiovascular: Normal rate and regular rhythm.   Pulmonary/Chest: Breath sounds normal. No respiratory distress. She has no wheezes.  Abdominal: Soft. Bowel sounds are normal. There is no tenderness.  Musculoskeletal: She exhibits no edema or tenderness.  Lymphadenopathy:    She has no cervical adenopathy.  Skin: No rash noted. No erythema.  Psychiatric: She has a normal mood **Note De-Identified Kundrat Obfuscation** and affect. Her behavior is normal.    BP 110/70 mmHg  Pulse 71  Temp(Src) 97.6 F (36.4 C) (Oral)  Resp 18  Ht 5' 4.5" (1.638 m)  Wt 145 lb 6 oz (65.942 kg)  BMI 24.58 kg/m2  SpO2 98% Wt Readings from Last 3 Encounters:  04/21/16 145 lb 6 oz (65.942 kg)  02/23/16 147 lb 12 oz (67.019 kg)  11/25/15 146 lb 8 oz (66.452 kg)     Lab Results  Component Value Date   WBC 6.3 04/07/2016   HGB 12.7 04/07/2016   HCT 37.5 04/07/2016   PLT 208.0 04/07/2016   GLUCOSE 93 02/23/2016   CHOL 177 01/29/2014   TRIG 60.0 01/29/2014   HDL 42.70 01/29/2014   LDLCALC 122* 01/29/2014   ALT 9  02/23/2016   AST 10 02/23/2016   NA 139 02/23/2016   K 4.1 02/23/2016   CL 105 02/23/2016   CREATININE 0.78 02/23/2016   BUN 17 02/23/2016   CO2 28 02/23/2016   TSH 1.53 04/07/2016    US Soft Tissue Head/neck  07/03/2015  CLINICAL DATA:  Single thyroid nodule EXAM: THYROID ULTRASOUND TECHNIQUE: Ultrasound examination of the thyroid gland and adjacent soft tissues was performed. COMPARISON:  11/11/2014, 08/27/2014 FINDINGS: Right thyroid lobe Measurements: 4.4 x 1.3 x 1.7 cm. 1.1 cm hypoechoic nodule is again noted within the midportion of the right lobe of the thyroid. This has been previously biopsied. Left thyroid lobe Measurements: 4.0 x 1.5 x 1.6 cm.  No nodules visualized. Isthmus Thickness: 2 mm.  No nodules visualized. Lymphadenopathy None visualized. IMPRESSION: Stable right thyroid nodule.  No new focal abnormality is noted. Electronically Signed   By: Inez Catalina M.D.   On: 07/03/2015 10:01       Assessment & Plan:   Problem List Items Addressed This Visit    B12 deficiency   Relevant Orders   Vitamin B12   Change in bowel habits    Improved.  Follow.        Fatigue - Primary    On B12 and synthroid now.  Feels better.  Follow.        Hypothyroidism    On thyroid replacement now.  Follow tsh.       Relevant Medications   levothyroxine (SYNTHROID, LEVOTHROID) 50 MCG tablet   Right thyroid nodule    Right thyroid nodule with pathology revealing cytologic atypia.  Planning for biopsy with genetic testing.  Continue f/u with Dr Harlow Asa.        Relevant Medications   levothyroxine (SYNTHROID, LEVOTHROID) 50 MCG tablet   Other Relevant Orders   TSH       Einar Pheasant, MD

## 2016-04-24 ENCOUNTER — Encounter: Payer: Self-pay | Admitting: Internal Medicine

## 2016-04-24 DIAGNOSIS — E538 Deficiency of other specified B group vitamins: Secondary | ICD-10-CM | POA: Insufficient documentation

## 2016-04-24 DIAGNOSIS — E039 Hypothyroidism, unspecified: Secondary | ICD-10-CM | POA: Insufficient documentation

## 2016-04-24 NOTE — Assessment & Plan Note (Signed)
**Note De-Identified Sciascia Obfuscation** On thyroid replacement now.  Follow tsh.

## 2016-04-24 NOTE — Assessment & Plan Note (Signed)
**Note De-identified Oelke Obfuscation** Improved.  Follow.  

## 2016-04-24 NOTE — Assessment & Plan Note (Signed)
**Note De-Identified Tomasso Obfuscation** On B12 and synthroid now.  Feels better.  Follow.

## 2016-04-24 NOTE — Assessment & Plan Note (Signed)
**Note De-identified Furey Obfuscation** Continue B12 injections.   

## 2016-04-24 NOTE — Assessment & Plan Note (Signed)
**Note De-Identified Delsignore Obfuscation** Right thyroid nodule with pathology revealing cytologic atypia.  Planning for biopsy with genetic testing.  Continue f/u with Dr Harlow Asa.

## 2016-04-26 ENCOUNTER — Telehealth: Payer: Self-pay | Admitting: *Deleted

## 2016-04-26 ENCOUNTER — Telehealth: Payer: Self-pay | Admitting: Internal Medicine

## 2016-04-26 MED ORDER — "SYRINGE/NEEDLE (DISP) 25G X 1"" 3 ML MISC": Status: DC

## 2016-04-26 MED ORDER — CYANOCOBALAMIN 1000 MCG/ML IJ SOLN: 1000.0000 ug | INTRAMUSCULAR | Status: DC

## 2016-04-26 NOTE — Telephone Encounter (Signed)
**Note De-Identified Tecson Obfuscation** Patient requested to have her B12 Rx sent over to [facility]

## 2016-04-26 NOTE — Telephone Encounter (Signed)
**Note De-Identified Blasius Obfuscation** Pt wants to cancel her appt on July 5th for a B12 and pick the B12 up from our office. She has an Therapist, sports that will give it to her. Please call her at [phone].

## 2016-04-26 NOTE — Telephone Encounter (Signed)
**Note De-Identified Montesinos Obfuscation** Called Pt. She is calling her insurance to check if the B12 will be covered. Will call office back.

## 2016-04-26 NOTE — Telephone Encounter (Signed)
**Note De-Identified Plaza Obfuscation** Sent in prescription and called patient, canceled upcoming nurse visit per her request. thanks

## 2016-04-26 NOTE — Telephone Encounter (Signed)
**Note De-identified Ferrera Obfuscation** See separate message.

## 2016-04-26 NOTE — Telephone Encounter (Signed)
**Note De-Identified Theisen Obfuscation** We don't dispense medication for the patient she will need to have a prescription sent to the pharmacy to pick up if this is the route she wants to go with it.  Thanks

## 2016-04-28 ENCOUNTER — Ambulatory Visit: Payer: BLUE CROSS/BLUE SHIELD

## 2016-04-28 MED ORDER — CYANOCOBALAMIN 1000 MCG/ML IJ SOLN: 1000.0000 ug | INTRAMUSCULAR | Status: DC

## 2016-04-28 NOTE — Telephone Encounter (Signed)
**Note De-Identified Beltran Obfuscation** Pt called stating that the RX need to be done in 90 day increments in order for insurance to cover.. Please advise pt

## 2016-04-28 NOTE — Telephone Encounter (Signed)
**Note De-Identified Akre Obfuscation** Resent for 90 day supply.  Notified patient that prescription was resent in., thanks

## 2016-04-28 NOTE — Addendum Note (Signed)
**Note De-Identified Mcclenney Obfuscation** Addended by: Bevelyn Ngo on: 04/28/2016 10:43 AM   Modules accepted: Orders

## 2016-05-25 ENCOUNTER — Other Ambulatory Visit: Payer: Self-pay | Admitting: Internal Medicine

## 2016-07-12 ENCOUNTER — Ambulatory Visit: Payer: BLUE CROSS/BLUE SHIELD

## 2016-07-13 ENCOUNTER — Telehealth: Payer: Self-pay | Admitting: *Deleted

## 2016-07-13 NOTE — Telephone Encounter (Signed)
**Note De-Identified Maiden Obfuscation** Pt is coming in on 07/15/16 for labs. The note states "fasting labs" & pt is scheduled at 4pm. Pt has an upcoming appt with you on 07/22/16. There are no fasting labs ordered. Only TSH & B12.

## 2016-07-14 NOTE — Telephone Encounter (Signed)
**Note De-identified Laramee Obfuscation** Noted  

## 2016-07-14 NOTE — Telephone Encounter (Signed)
**Note De-Identified Greenfeld Obfuscation** I reviewed my instructions for labs at her last appt and this should be non fasting lab appt.  No fasting labs needed per note.  Thanks

## 2016-07-15 ENCOUNTER — Other Ambulatory Visit (INDEPENDENT_AMBULATORY_CARE_PROVIDER_SITE_OTHER): Payer: BLUE CROSS/BLUE SHIELD

## 2016-07-15 DIAGNOSIS — E041 Nontoxic single thyroid nodule: Secondary | ICD-10-CM | POA: Diagnosis not present

## 2016-07-15 DIAGNOSIS — E538 Deficiency of other specified B group vitamins: Secondary | ICD-10-CM

## 2016-07-16 LAB — TSH: TSH: 1.87 u[IU]/mL (ref 0.35–4.50)

## 2016-07-16 LAB — VITAMIN B12: Vitamin B-12: 409 pg/mL (ref 211–911)

## 2016-07-22 ENCOUNTER — Encounter: Payer: Self-pay | Admitting: Internal Medicine

## 2016-07-22 ENCOUNTER — Ambulatory Visit (INDEPENDENT_AMBULATORY_CARE_PROVIDER_SITE_OTHER): Payer: BLUE CROSS/BLUE SHIELD | Admitting: Internal Medicine

## 2016-07-22 VITALS — BP 100/60 | HR 62 | Temp 98.3°F | Wt 145.8 lb

## 2016-07-22 DIAGNOSIS — E538 Deficiency of other specified B group vitamins: Secondary | ICD-10-CM

## 2016-07-22 DIAGNOSIS — E041 Nontoxic single thyroid nodule: Secondary | ICD-10-CM

## 2016-07-22 DIAGNOSIS — R5383 Other fatigue: Secondary | ICD-10-CM | POA: Diagnosis not present

## 2016-07-22 DIAGNOSIS — R1030 Lower abdominal pain, unspecified: Secondary | ICD-10-CM | POA: Diagnosis not present

## 2016-07-22 DIAGNOSIS — E038 Other specified hypothyroidism: Secondary | ICD-10-CM

## 2016-07-22 DIAGNOSIS — E2839 Other primary ovarian failure: Secondary | ICD-10-CM

## 2016-07-22 MED ORDER — CYANOCOBALAMIN 1000 MCG/ML IJ SOLN: 1000.0000 ug | INTRAMUSCULAR | 1 refills | Status: DC

## 2016-07-22 NOTE — Progress Notes (Signed)
**Note De-identified Plaskett Obfuscation** Pre visit review using our clinic review tool, if applicable. No additional management support is needed unless otherwise documented below in the visit note. 

## 2016-07-22 NOTE — Progress Notes (Signed)
**Note De-Identified Formica Obfuscation** Patient ID: Sandy Jefferson, female   DOB: 09-04-1981, 35 y.o.   MRN: [id number]   Subjective:    Patient ID: Sandy Munson Schaff, female    DOB: September 17, 1981, 35 y.o.   MRN: [id number]  HPI  Patient here for a scheduled follow up.  She is feeling better.  Receiving B12 injections now.  States feels better the first few weeks after an injection and then feels fatigued again.  Was questioning if she could receive "more B12".  Overall better.  Eating and drinking well.  No nausea or vomiting.  Bowels moving.  No abdominal pain or cramping.  Breathing stable.     Past Medical History:  Diagnosis Date  . Bladder troubles   . Migraine headache    Past Surgical History:  Procedure Laterality Date  . CESAREAN SECTION    . HERNIA REPAIR    . HYSTEROSCOPY     Family History  Problem Relation Age of Onset  . Heart disease Brother   . Hyperlipidemia Mother   . Hyperlipidemia Father   . Cancer Maternal Aunt     breast  . Stroke Maternal Grandmother   . Hyperlipidemia Maternal Grandfather   . Cancer Paternal Grandmother     lung  . Diabetes Paternal Grandmother   . Diabetes Paternal Grandfather   . Colon cancer      great grandmother   Social History   Social History  . Marital status: Married    Spouse name: N/A  . Number of children: 4  . Years of education: N/A   Social History Main Topics  . Smoking status: Never Smoker  . Smokeless tobacco: Never Used  . Alcohol use 0.0 oz/week     Comment: rarely  . Drug use: No  . Sexual activity: Not Asked   Other Topics Concern  . None   Social History Narrative  . None    Outpatient Encounter Prescriptions as of 07/22/2016  Medication Sig  . cyanocobalamin (,VITAMIN B-12,) 1000 MCG/ML injection Inject 1 mL (1,000 mcg total) into the muscle every 30 (thirty) days. Use needles and syringes for administration.  Marland Kitchen estradiol (ESTRACE) 1 MG tablet Take 1 tablet (1 mg total) by mouth daily.  Marland Kitchen levothyroxine (SYNTHROID, LEVOTHROID) 50 MCG tablet  TAKE 1 TABLET BY MOUTH EVERY DAY IN THE MORNING ON AN EMPTY STOMACH  . SYRINGE-NEEDLE, DISP, 3 ML 25G X 1" 3 ML MISC Use one syringe/needle set for each B12 administration once a month.  . [DISCONTINUED] cyanocobalamin (,VITAMIN B-12,) 1000 MCG/ML injection Inject 1 mL (1,000 mcg total) into the muscle every 30 (thirty) days. Use needles and syringes for administration.   No facility-administered encounter medications on file as of 07/22/2016.     Review of Systems  Constitutional: Positive for fatigue. Negative for appetite change and unexpected weight change.  HENT: Negative for congestion and sinus pressure.   Respiratory: Negative for cough, chest tightness and shortness of breath.   Cardiovascular: Negative for chest pain, palpitations and leg swelling.  Gastrointestinal: Negative for abdominal pain, diarrhea, nausea and vomiting.  Genitourinary: Negative for difficulty urinating and dysuria.  Musculoskeletal: Negative for back pain and joint swelling.  Skin: Negative for color change and rash.  Neurological: Negative for dizziness, light-headedness and headaches.  Psychiatric/Behavioral: Negative for agitation and dysphoric mood.       Objective:    Physical Exam  Constitutional: She appears well-developed and well-nourished. No distress.  HENT:  Nose: Nose normal.  Mouth/Throat: Oropharynx is clear and moist. **Note De-Identified Koopmann Obfuscation** Neck: Neck supple. No thyromegaly present.  Cardiovascular: Normal rate and regular rhythm.   Pulmonary/Chest: Breath sounds normal. No respiratory distress. She has no wheezes.  Abdominal: Soft. Bowel sounds are normal. There is no tenderness.  Musculoskeletal: She exhibits no edema or tenderness.  Lymphadenopathy:    She has no cervical adenopathy.  Skin: No rash noted. No erythema.  Psychiatric: She has a normal mood and affect. Her behavior is normal.    BP 100/60   Pulse 62   Temp 98.3 F (36.8 C) (Oral)   Wt 145 lb 12.8 oz (66.1 kg)   SpO2 98%   BMI  24.64 kg/m  Wt Readings from Last 3 Encounters:  07/22/16 145 lb 12.8 oz (66.1 kg)  04/21/16 145 lb 6 oz (65.9 kg)  02/23/16 147 lb 12 oz (67 kg)     Lab Results  Component Value Date   WBC 6.3 04/07/2016   HGB 12.7 04/07/2016   HCT 37.5 04/07/2016   PLT 208.0 04/07/2016   GLUCOSE 93 02/23/2016   CHOL 177 01/29/2014   TRIG 60.0 01/29/2014   HDL 42.70 01/29/2014   LDLCALC 122 (H) 01/29/2014   ALT 9 02/23/2016   AST 10 02/23/2016   NA 139 02/23/2016   K 4.1 02/23/2016   CL 105 02/23/2016   CREATININE 0.78 02/23/2016   BUN 17 02/23/2016   CO2 28 02/23/2016   TSH 1.87 07/15/2016    US Soft Tissue Head/neck  Result Date: 07/03/2015 CLINICAL DATA:  Single thyroid nodule EXAM: THYROID ULTRASOUND TECHNIQUE: Ultrasound examination of the thyroid gland and adjacent soft tissues was performed. COMPARISON:  11/11/2014, 08/27/2014 FINDINGS: Right thyroid lobe Measurements: 4.4 x 1.3 x 1.7 cm. 1.1 cm hypoechoic nodule is again noted within the midportion of the right lobe of the thyroid. This has been previously biopsied. Left thyroid lobe Measurements: 4.0 x 1.5 x 1.6 cm.  No nodules visualized. Isthmus Thickness: 2 mm.  No nodules visualized. Lymphadenopathy None visualized. IMPRESSION: Stable right thyroid nodule.  No new focal abnormality is noted. Electronically Signed   By: Inez Catalina M.D.   On: 07/03/2015 10:01       Assessment & Plan:   Problem List Items Addressed This Visit    B12 deficiency    Continue B12 injections.        Estrogen deficiency    S/p TAH/BSO.  Started on estrogen by gyn.  Remains on oral estrogen.        Fatigue    On synthroid.  TSH just checked and wnl.  Receiving B12 injections.  Improved.  Will follow.  Recent B12 level improved.        Hypothyroidism    On thyroid replacement.  Follow tsh.  Recent check wnl.        Right thyroid nodule    Seeing Dr Harlow Asa.  S/p biopsy.        Other Visit Diagnoses    Suprapubic pain, unspecified  laterality    -  Primary   Relevant Orders   Urinalysis, Routine w reflex microscopic (not at [facility]) (Completed)     I spent 25 minutes with the patient and more than 50% of the time was spent in consultation regarding the above.     Einar Pheasant, MD'

## 2016-07-23 LAB — URINALYSIS, ROUTINE W REFLEX MICROSCOPIC
Bilirubin Urine: NEGATIVE
Hgb urine dipstick: NEGATIVE
Ketones, ur: NEGATIVE
LEUKOCYTES UA: NEGATIVE
Nitrite: NEGATIVE
PH: 6 (ref 5.0–8.0)
RBC / HPF: NONE SEEN (ref 0–?)
Specific Gravity, Urine: >=1.03 — AB (ref 1.000–1.030)
TOTAL PROTEIN, URINE-UPE24: NEGATIVE
Urine Glucose: NEGATIVE
Urobilinogen, UA: 0.2 (ref 0.0–1.0)
WBC, UA: NONE SEEN (ref 0–?)

## 2016-07-25 ENCOUNTER — Encounter: Payer: Self-pay | Admitting: Internal Medicine

## 2016-07-25 NOTE — Assessment & Plan Note (Signed)
**Note De-Identified Manthe Obfuscation** Seeing Dr Harlow Asa.  S/p biopsy.

## 2016-07-25 NOTE — Assessment & Plan Note (Signed)
**Note De-identified Zorn Obfuscation** On thyroid replacement.  Follow tsh. Recent check wnl.  

## 2016-07-25 NOTE — Assessment & Plan Note (Signed)
**Note De-identified Hecht Obfuscation** Continue B12 injections.   

## 2016-07-25 NOTE — Assessment & Plan Note (Signed)
**Note De-Identified Caillier Obfuscation** On synthroid.  TSH just checked and wnl.  Receiving B12 injections.  Improved.  Will follow.  Recent B12 level improved.

## 2016-07-25 NOTE — Assessment & Plan Note (Signed)
**Note De-Identified Farabaugh Obfuscation** S/p TAH/BSO.  Started on estrogen by gyn.  Remains on oral estrogen.

## 2016-07-26 ENCOUNTER — Telehealth: Payer: Self-pay | Admitting: *Deleted

## 2016-07-26 NOTE — Telephone Encounter (Signed)
**Note De-Identified Alden Obfuscation** Pt requested lab results  Pt contact [phone]

## 2016-08-22 ENCOUNTER — Other Ambulatory Visit: Payer: Self-pay | Admitting: Internal Medicine

## 2016-09-17 ENCOUNTER — Other Ambulatory Visit: Payer: Self-pay | Admitting: Internal Medicine

## 2016-11-04 ENCOUNTER — Ambulatory Visit (INDEPENDENT_AMBULATORY_CARE_PROVIDER_SITE_OTHER): Payer: BLUE CROSS/BLUE SHIELD | Admitting: Internal Medicine

## 2016-11-04 ENCOUNTER — Encounter: Payer: Self-pay | Admitting: Internal Medicine

## 2016-11-04 VITALS — BP 108/74 | HR 72 | Temp 98.4°F | Ht 65.0 in | Wt 143.8 lb

## 2016-11-04 DIAGNOSIS — E038 Other specified hypothyroidism: Secondary | ICD-10-CM

## 2016-11-04 DIAGNOSIS — R194 Change in bowel habit: Secondary | ICD-10-CM | POA: Diagnosis not present

## 2016-11-04 DIAGNOSIS — E041 Nontoxic single thyroid nodule: Secondary | ICD-10-CM | POA: Diagnosis not present

## 2016-11-04 DIAGNOSIS — E538 Deficiency of other specified B group vitamins: Secondary | ICD-10-CM | POA: Diagnosis not present

## 2016-11-04 NOTE — Progress Notes (Signed)
**Note De-identified Coltrin Obfuscation** Pre visit review using our clinic review tool, if applicable. No additional management support is needed unless otherwise documented below in the visit note. 

## 2016-11-04 NOTE — Progress Notes (Signed)
**Note De-Identified [place] Obfuscation** Patient ID: Sandy Jefferson, female   DOB: 04/10/1981, 36 y.o.   MRN: [id number]   Subjective:    Patient ID: Sandy Jefferson, female    DOB: 10/18/1981, 36 y.o.   MRN: [id number]  HPI  Patient here for a scheduled follow up.  She reports she is overall doing better.  Taking synthroid.  No swallowing issues now.  She does report some pain up in her rectum.  Intermittent.  May occur 3-4x/week.  Feels like a knife sticking up in her rectum.  Has had issues with bowel changes.  See previous note.  No blood in her stool. Breathing stable.  Eating.  No nausea or vomiting.     Past Medical History:  Diagnosis Date  . Bladder troubles   . Migraine headache    Past Surgical History:  Procedure Laterality Date  . CESAREAN SECTION    . HERNIA REPAIR    . HYSTEROSCOPY     Family History  Problem Relation Age of Onset  . Heart disease Brother   . Hyperlipidemia Mother   . Hyperlipidemia Father   . Cancer Maternal Aunt     breast  . Stroke Maternal Grandmother   . Hyperlipidemia Maternal Grandfather   . Cancer Paternal Grandmother     lung  . Diabetes Paternal Grandmother   . Diabetes Paternal Grandfather   . Colon cancer      great grandmother   Social History   Social History  . Marital status: Married    Spouse name: N/A  . Number of children: 4  . Years of education: N/A   Social History Main Topics  . Smoking status: Never Smoker  . Smokeless tobacco: Never Used  . Alcohol use 0.0 oz/week     Comment: rarely  . Drug use: No  . Sexual activity: Not Asked   Other Topics Concern  . None   Social History Narrative  . None    Outpatient Encounter Prescriptions as of 11/04/2016  Medication Sig  . cyanocobalamin (,VITAMIN B-12,) 1000 MCG/ML injection Inject 1 mL (1,000 mcg total) into the muscle every 30 (thirty) days. Use needles and syringes for administration.  Marland Kitchen estradiol (ESTRACE) 1 MG tablet TAKE 1 TABLET DAILY  . levothyroxine (SYNTHROID, LEVOTHROID) 50 MCG tablet TAKE  1 TABLET DAILY IN THE MORNING ON AN EMPTY STOMACH  . SYRINGE-NEEDLE, DISP, 3 ML 25G X 1" 3 ML MISC Use one syringe/needle set for each B12 administration once a month.   No facility-administered encounter medications on file as of 11/04/2016.     Review of Systems  Constitutional: Negative for appetite change and unexpected weight change.  HENT: Negative for congestion and sinus pressure.   Respiratory: Negative for cough, chest tightness and shortness of breath.   Cardiovascular: Negative for chest pain, palpitations and leg swelling.  Gastrointestinal: Negative for abdominal pain, diarrhea, nausea and vomiting.       Rectal pain and pain that shoots up through her abdomen.    Genitourinary: Negative for difficulty urinating and dysuria.  Musculoskeletal: Negative for back pain and joint swelling.  Skin: Negative for color change and rash.  Neurological: Negative for dizziness, light-headedness and headaches.  Psychiatric/Behavioral: Negative for agitation and dysphoric mood.       Objective:    Physical Exam  Constitutional: She appears well-developed and well-nourished. No distress.  HENT:  Nose: Nose normal.  Mouth/Throat: Oropharynx is clear and moist.  Neck: Neck supple. No thyromegaly present.  Cardiovascular: Normal rate and regular **Note De-Identified Ginger Obfuscation** rhythm.   Pulmonary/Chest: Breath sounds normal. No respiratory distress. She has no wheezes.  Abdominal: Soft. Bowel sounds are normal. There is no tenderness.  Musculoskeletal: She exhibits no edema or tenderness.  Lymphadenopathy:    She has no cervical adenopathy.  Skin: No rash noted. No erythema.  Psychiatric: She has a normal mood and affect. Her behavior is normal.    BP 108/74   Pulse 72   Temp 98.4 F (36.9 C) (Oral)   Ht 5\' 5"  (1.651 m)   Wt 143 lb 12.8 oz (65.2 kg)   SpO2 98%   BMI 23.93 kg/m  Wt Readings from Last 3 Encounters:  11/04/16 143 lb 12.8 oz (65.2 kg)  07/22/16 145 lb 12.8 oz (66.1 kg)  04/21/16 145 lb 6 oz  (65.9 kg)     Lab Results  Component Value Date   WBC 6.3 04/07/2016   HGB 12.7 04/07/2016   HCT 37.5 04/07/2016   PLT 208.0 04/07/2016   GLUCOSE 93 02/23/2016   CHOL 177 01/29/2014   TRIG 60.0 01/29/2014   HDL 42.70 01/29/2014   LDLCALC 122 (H) 01/29/2014   ALT 9 02/23/2016   AST 10 02/23/2016   NA 139 02/23/2016   K 4.1 02/23/2016   CL 105 02/23/2016   CREATININE 0.78 02/23/2016   BUN 17 02/23/2016   CO2 28 02/23/2016   TSH 1.87 07/15/2016    US Soft Tissue Head/neck  Result Date: 07/03/2015 CLINICAL DATA:  Single thyroid nodule EXAM: THYROID ULTRASOUND TECHNIQUE: Ultrasound examination of the thyroid gland and adjacent soft tissues was performed. COMPARISON:  11/11/2014, 08/27/2014 FINDINGS: Right thyroid lobe Measurements: 4.4 x 1.3 x 1.7 cm. 1.1 cm hypoechoic nodule is again noted within the midportion of the right lobe of the thyroid. This has been previously biopsied. Left thyroid lobe Measurements: 4.0 x 1.5 x 1.6 cm.  No nodules visualized. Isthmus Thickness: 2 mm.  No nodules visualized. Lymphadenopathy None visualized. IMPRESSION: Stable right thyroid nodule.  No new focal abnormality is noted. Electronically Signed   By: Inez Catalina M.D.   On: 07/03/2015 10:01       Assessment & Plan:   Problem List Items Addressed This Visit    B12 deficiency    Continue B12 injections.        Change in bowel habits - Primary    Change in bowel habits and pain as outlined.  Discussed further evaluation.  Wants referral to GI for evaluation and question of need for colonoscopy.        Relevant Orders   Ambulatory referral to Gastroenterology   Hypothyroidism    On thyroid replacement. Follow tsh.        Right thyroid nodule    S/p biopsy.  Has seen Dr Harlow Asa.  Follow.            Einar Pheasant, MD

## 2016-11-07 ENCOUNTER — Encounter: Payer: Self-pay | Admitting: Internal Medicine

## 2016-11-07 NOTE — Assessment & Plan Note (Signed)
**Note De-identified Vanduyne Obfuscation** On thyroid replacement.  Follow tsh.  

## 2016-11-07 NOTE — Assessment & Plan Note (Signed)
**Note De-identified Arango Obfuscation** Continue B12 injections.   

## 2016-11-07 NOTE — Assessment & Plan Note (Signed)
**Note De-Identified Eckart Obfuscation** S/p biopsy.  Has seen Dr Harlow Asa.  Follow.

## 2016-11-07 NOTE — Assessment & Plan Note (Signed)
**Note De-Identified Schiano Obfuscation** Change in bowel habits and pain as outlined.  Discussed further evaluation.  Wants referral to GI for evaluation and question of need for colonoscopy.

## 2016-11-29 ENCOUNTER — Ambulatory Visit (INDEPENDENT_AMBULATORY_CARE_PROVIDER_SITE_OTHER): Payer: BLUE CROSS/BLUE SHIELD | Admitting: Gastroenterology

## 2016-11-29 ENCOUNTER — Encounter: Payer: Self-pay | Admitting: Gastroenterology

## 2016-11-29 VITALS — BP 104/68 | HR 66 | Ht 65.0 in | Wt 147.0 lb

## 2016-11-29 DIAGNOSIS — R194 Change in bowel habit: Secondary | ICD-10-CM | POA: Diagnosis not present

## 2016-11-29 NOTE — Progress Notes (Signed)
**Note De-Identified Nolt Obfuscation** Gastroenterology Consultation  Referring Provider:     Einar Pheasant, MD Primary Care Physician:  Einar Pheasant, MD Primary Gastroenterologist:  Dr. Jonathon Bellows  Reason for Consultation:     Change in bowel habits         HPI:   Sandy Jefferson is a 36 y.o. y/o female referred for consultation & management  by Dr. Einar Pheasant, MD.   She has been referred for a change in bowel habits.   She noticed a change after her hysterectomy a few years back for menorrhagia . She has 4 kids. The change she noticed was the she feels she wants to go , has the sensation , she will sit on the potty but cant pass the stool through . When does go the stool is very hard. When she does go has a very hard bowel movement . She has a bowel movement 2-3 days . All kids were delivered by breech x 4 c section . No protrusion outside the vagina. No issues with urinating . She does have to use a finger in her rectum and vagina to evacuate. No accidents. Denies any trauma to the anus. Sometimes when she passes stool it hurts, Denies any blood.   Great grand mother had colon cancer. She does consume a lot of fruit and vegetable daily. Never had a colonoscopy.   Past Medical History:  Diagnosis Date  . Bladder troubles   . Migraine headache     Past Surgical History:  Procedure Laterality Date  . CESAREAN SECTION    . HERNIA REPAIR    . HYSTEROSCOPY      Prior to Admission medications   Medication Sig Start Date End Date Taking? Authorizing Provider  cyanocobalamin (,VITAMIN B-12,) 1000 MCG/ML injection Inject 1 mL (1,000 mcg total) into the muscle every 30 (thirty) days. Use needles and syringes for administration. 07/22/16  Yes Einar Pheasant, MD  estradiol (ESTRACE) 1 MG tablet TAKE 1 TABLET DAILY 08/23/16  Yes Einar Pheasant, MD  levothyroxine (SYNTHROID, LEVOTHROID) 50 MCG tablet TAKE 1 TABLET DAILY IN THE MORNING ON AN EMPTY STOMACH 09/17/16  Yes Einar Pheasant, MD  SYRINGE-NEEDLE, DISP, 3 ML  25G X 1" 3 ML MISC Use one syringe/needle set for each B12 administration once a month. 04/26/16  Yes Einar Pheasant, MD    Family History  Problem Relation Age of Onset  . Heart disease Brother   . Hyperlipidemia Mother   . Hyperlipidemia Father   . Cancer Maternal Aunt     breast  . Stroke Maternal Grandmother   . Hyperlipidemia Maternal Grandfather   . Cancer Paternal Grandmother     lung  . Diabetes Paternal Grandmother   . Diabetes Paternal Grandfather   . Colon cancer      great grandmother     Social History  Substance Use Topics  . Smoking status: Never Smoker  . Smokeless tobacco: Never Used  . Alcohol use 0.0 oz/week     Comment: rarely    Allergies as of 11/29/2016 - Review Complete 11/29/2016  Allergen Reaction Noted  . Aspirin  01/26/2013    Review of Systems:    All systems reviewed and negative except where noted in HPI.   Physical Exam:  BP 104/68   Pulse 66   Ht 5\' 5"  (1.651 m)   Wt 147 lb (66.7 kg)   BMI 24.46 kg/m  No LMP recorded. Patient has had a hysterectomy. Psych:  Alert and cooperative. **Note De-Identified Archey Obfuscation** Normal mood and affect. General:   Alert,  Well-developed, well-nourished, pleasant and cooperative in NAD Head:  Normocephalic and atraumatic. Eyes:  Sclera clear, no icterus.   Conjunctiva pink. Ears:  Normal auditory acuity. Nose:  No deformity, discharge, or lesions. Mouth:  No deformity or lesions,oropharynx pink & moist. Neck:  Supple; no masses or thyromegaly. Lungs:  Respirations even and unlabored.  Clear throughout to auscultation.   No wheezes, crackles, or rhonchi. No acute distress. Heart:  Regular rate and rhythm; no murmurs, clicks, rubs, or gallops. Abdomen:  Normal bowel sounds.  No bruits.  Soft, non-tender and non-distended without masses, hepatosplenomegaly or hernias noted.  No guarding or rebound tenderness.    Msk:  Symmetrical without gross deformities. Good, equal movement & strength bilaterally. Extremities:  No clubbing or  edema.  No cyanosis. Neurologic:  Alert and oriented x3;  grossly normal neurologically. Psych:  Alert and cooperative. Normal mood and affect.  Imaging Studies: No results found.  Assessment and Plan:   Sandy Jefferson is a 36 y.o. y/o female has been referred for change in bowel habits. Her history suggests she has an element of constipation , irregular timing of her bowel movements, probably a lot of scar tissue from 4 c sections, hysterectomy which has caused a change in the ano rectal angle affecting defecation. There is also a possibility of weak pelvic ligaments contrubuting to same .   Plan   1. 4-5 portions of fruit and vegetable a day  2. Colonoscopy to rule out any luminal obstruction  3. Miralax daily  4. If no better after options include ano rectal manometry, use of linzess ruling out pelvic floor dysfunction   Follow up in 6-8 weeks  Dr Jonathon Bellows MD

## 2016-11-29 NOTE — Patient Instructions (Addendum)
**Note De-Identified Bole Obfuscation** High-Fiber Diet Fiber, also called dietary fiber, is a type of carbohydrate found in fruits, vegetables, whole grains, and beans. A high-fiber diet can have many health benefits. Your health care provider may recommend a high-fiber diet to help:  Prevent constipation. Fiber can make your bowel movements more regular.  Lower your cholesterol.  Relieve hemorrhoids, uncomplicated diverticulosis, or irritable bowel syndrome.  Prevent overeating as part of a weight-loss plan.  Prevent heart disease, type 2 diabetes, and certain cancers. What is my plan? The recommended daily intake of fiber includes:  38 grams for men under age 66.  42 grams for men over age 84.  36 grams for women under age 46.  15 grams for women over age 15. You can get the recommended daily intake of dietary fiber by eating a variety of fruits, vegetables, grains, and beans. Your health care provider may also recommend a fiber supplement if it is not possible to get enough fiber through your diet. What do I need to know about a high-fiber diet?  Fiber supplements have not been widely studied for their effectiveness, so it is better to get fiber through food sources.  Always check the fiber content on thenutrition facts label of any prepackaged food. Look for foods that contain at least 5 grams of fiber per serving.  Ask your dietitian if you have questions about specific foods that are related to your condition, especially if those foods are not listed in the following section.  Increase your daily fiber consumption gradually. Increasing your intake of dietary fiber too quickly may cause bloating, cramping, or gas.  Drink plenty of water. Water helps you to digest fiber. What foods can I eat? Grains  Whole-grain breads. Multigrain cereal. Oats and oatmeal. Brown rice. Barley. Bulgur wheat. [place]. Bran muffins. Popcorn. Rye wafer crackers. Vegetables  Sweet potatoes. Spinach. Kale. Artichokes. Cabbage. Broccoli.  Green peas. Carrots. Squash. Fruits  Berries. Pears. Apples. Oranges. Avocados. Prunes and raisins. Dried figs. Meats and Other Protein Sources  Navy, kidney, pinto, and soy beans. Split peas. Lentils. Nuts and seeds. Dairy  Fiber-fortified yogurt. Beverages  Fiber-fortified soy milk. Fiber-fortified orange juice. Other  Fiber bars. The items listed above may not be a complete list of recommended foods or beverages. Contact your dietitian for more options.  What foods are not recommended? Grains  White bread. Pasta made with refined flour. White rice. Vegetables  Fried potatoes. Canned vegetables. Well-cooked vegetables. Fruits  Fruit juice. Cooked, strained fruit. Meats and Other Protein Sources  Fatty cuts of meat. Fried Sales executive or fried fish. Dairy  Milk. Yogurt. Cream cheese. Sour cream. Beverages  Soft drinks. Other  Cakes and pastries. Butter and oils. The items listed above may not be a complete list of foods and beverages to avoid. Contact your dietitian for more information.  What are some tips for including high-fiber foods in my diet?  Eat a wide variety of high-fiber foods.  Make sure that half of all grains consumed each day are whole grains.  Replace breads and cereals made from refined flour or white flour with whole-grain breads and cereals.  Replace white rice with brown rice, bulgur wheat, or millet.  Start the day with a breakfast that is high in fiber, such as a cereal that contains at least 5 grams of fiber per serving.  Use beans in place of meat in soups, salads, or pasta.  Eat high-fiber snacks, such as berries, raw vegetables, nuts, or popcorn. This information is not intended to replace **Note De-identified Clagg Obfuscation** questions you have with your health care provider. Document Released: 10/11/2005 Document Revised: 03/18/2016 Document Reviewed: 03/26/2014 Elsevier Interactive Patient Education  2017  [employer]  

## 2016-12-01 ENCOUNTER — Other Ambulatory Visit: Payer: Self-pay

## 2016-12-17 ENCOUNTER — Ambulatory Visit
Admission: RE | Admit: 2016-12-17 | Payer: BLUE CROSS/BLUE SHIELD | Source: Ambulatory Visit | Admitting: Gastroenterology

## 2016-12-17 ENCOUNTER — Encounter: Admission: RE | Payer: Self-pay | Source: Ambulatory Visit

## 2016-12-17 SURGERY — COLONOSCOPY WITH PROPOFOL
Anesthesia: General

## 2017-01-04 ENCOUNTER — Telehealth: Payer: Self-pay

## 2017-01-04 NOTE — Telephone Encounter (Signed)
**Note De-Identified Botkins Obfuscation** Called to reschedule patient's colonoscopy.

## 2017-01-14 ENCOUNTER — Ambulatory Visit: Admit: 2017-01-14 | Payer: BLUE CROSS/BLUE SHIELD | Admitting: Gastroenterology

## 2017-01-14 SURGERY — COLONOSCOPY WITH PROPOFOL
Anesthesia: General

## 2017-02-08 ENCOUNTER — Encounter: Payer: BLUE CROSS/BLUE SHIELD | Admitting: Internal Medicine

## 2017-02-19 ENCOUNTER — Other Ambulatory Visit: Payer: Self-pay | Admitting: Internal Medicine

## 2017-03-16 ENCOUNTER — Other Ambulatory Visit: Payer: Self-pay | Admitting: Internal Medicine

## 2017-03-31 ENCOUNTER — Telehealth: Payer: Self-pay | Admitting: Internal Medicine

## 2017-03-31 ENCOUNTER — Other Ambulatory Visit: Payer: Self-pay

## 2017-03-31 MED ORDER — LEVOTHYROXINE SODIUM 50 MCG PO TABS: ORAL_TABLET | ORAL | 0 refills | Status: DC

## 2017-03-31 NOTE — Telephone Encounter (Signed)
**Note De-Identified Op[place] Obfuscation** Pt called requesting a refill on her levothyroxine (SYNTHROID, LEVOTHROID) 50 MCG tablet. Pt stated that it needs to be for a 90 day supply. Please advise, thank you!  Pharmacy - [facility] - [place], [place]

## 2017-03-31 NOTE — Telephone Encounter (Signed)
**Note De-Identified Mis Obfuscation** Refill sent to mail order called patient and made f/u app

## 2017-05-30 ENCOUNTER — Encounter: Payer: Self-pay | Admitting: Internal Medicine

## 2017-05-30 ENCOUNTER — Other Ambulatory Visit: Payer: Self-pay | Admitting: Internal Medicine

## 2017-05-30 ENCOUNTER — Ambulatory Visit (INDEPENDENT_AMBULATORY_CARE_PROVIDER_SITE_OTHER): Payer: BLUE CROSS/BLUE SHIELD | Admitting: Internal Medicine

## 2017-05-30 VITALS — BP 106/64 | HR 64 | Temp 98.6°F | Resp 12 | Ht 65.0 in | Wt 134.8 lb

## 2017-05-30 DIAGNOSIS — E038 Other specified hypothyroidism: Secondary | ICD-10-CM | POA: Diagnosis not present

## 2017-05-30 DIAGNOSIS — M25531 Pain in right wrist: Secondary | ICD-10-CM | POA: Diagnosis not present

## 2017-05-30 DIAGNOSIS — E538 Deficiency of other specified B group vitamins: Secondary | ICD-10-CM

## 2017-05-30 DIAGNOSIS — E041 Nontoxic single thyroid nodule: Secondary | ICD-10-CM

## 2017-05-30 DIAGNOSIS — D649 Anemia, unspecified: Secondary | ICD-10-CM

## 2017-05-30 DIAGNOSIS — E2839 Other primary ovarian failure: Secondary | ICD-10-CM

## 2017-05-30 DIAGNOSIS — R194 Change in bowel habit: Secondary | ICD-10-CM

## 2017-05-30 LAB — CBC WITH DIFFERENTIAL/PLATELET
BASOS ABS: 0 10*3/uL (ref 0.0–0.1)
Basophils Relative: 0.7 % (ref 0.0–3.0)
EOS PCT: 1.9 % (ref 0.0–5.0)
Eosinophils Absolute: 0.1 10*3/uL (ref 0.0–0.7)
HEMATOCRIT: 34.1 % — AB (ref 36.0–46.0)
Hemoglobin: 11.6 g/dL — ABNORMAL LOW (ref 12.0–15.0)
LYMPHS PCT: 19.7 % (ref 12.0–46.0)
Lymphs Abs: 1.4 10*3/uL (ref 0.7–4.0)
MCHC: 34.1 g/dL (ref 30.0–36.0)
MCV: 91.9 fl (ref 78.0–100.0)
MONOS PCT: 8.8 % (ref 3.0–12.0)
Monocytes Absolute: 0.6 10*3/uL (ref 0.1–1.0)
Neutro Abs: 4.8 10*3/uL (ref 1.4–7.7)
Neutrophils Relative %: 68.9 % (ref 43.0–77.0)
Platelets: 154 10*3/uL (ref 150.0–400.0)
RBC: 3.71 Mil/uL — AB (ref 3.87–5.11)
RDW: 12.8 % (ref 11.5–15.5)
WBC: 6.9 10*3/uL (ref 4.0–10.5)

## 2017-05-30 LAB — VITAMIN B12: Vitamin B-12: 575 pg/mL (ref 211–911)

## 2017-05-30 LAB — TSH: TSH: 2.93 u[IU]/mL (ref 0.35–4.50)

## 2017-05-30 MED ORDER — TETANUS-DIPHTH-ACELL PERTUSSIS 5-2.5-18.5 LF-MCG/0.5 IM SUSP: 0.5000 mL | Freq: Once | INTRAMUSCULAR | 0 refills | Status: AC

## 2017-05-30 MED ORDER — "SYRINGE/NEEDLE (DISP) 25G X 1"" 3 ML MISC": 1 refills | Status: DC

## 2017-05-30 MED ORDER — ESTRADIOL 1 MG PO TABS: 1.0000 mg | ORAL_TABLET | Freq: Every day | ORAL | 1 refills | Status: DC

## 2017-05-30 MED ORDER — CYANOCOBALAMIN 1000 MCG/ML IJ SOLN: 1000.0000 ug | INTRAMUSCULAR | 1 refills | Status: DC

## 2017-05-30 MED ORDER — LEVOTHYROXINE SODIUM 50 MCG PO TABS: ORAL_TABLET | ORAL | 0 refills | Status: DC

## 2017-05-30 NOTE — Progress Notes (Signed)
**Note De-Identified [place] [facility]** Patient ID: Sandy Jefferson, female   DOB: May 23, 1981, 36 y.o.   MRN: [id number]   Subjective:    Patient ID: Sandy Jefferson, female    DOB: [birth year], 36 y.o.   MRN: [id number]  HPI  Patient here for a scheduled follow up.  She reports she overall feels better.  Bowels are better.  She saw Dr Vicente Males.  Adjusted a few things that he suggested and bowels are better.  She decided not to pursue colonoscopy at this time.  She has lost weight.  Has been working.  No nausea or vomiting.  No abdominal pain.  Feels she is just more active.  Has been eating regular meals.  Breathing stable.  She did fall three weeks ago.  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  Her arm has been sore since.  Able to flex her wrist.  She reports aching.  Also reports some numbness in her right hand at night and will notice electric shocks up her wrist and arm during the day.  No increased swelling now.  Previous bruising resolve.  Splint aggravated.     Past Medical History:  Diagnosis Date  . Bladder troubles   . Migraine headache    Past Surgical History:  Procedure Laterality Date  . CESAREAN SECTION    . HERNIA REPAIR    . HYSTEROSCOPY     Family History  Problem Relation Age of Onset  . Heart disease Brother   . Hyperlipidemia Mother   . Hyperlipidemia Father   . Cancer Maternal Aunt        breast  . Stroke Maternal Grandmother   . Hyperlipidemia Maternal Grandfather   . Cancer Paternal Grandmother        lung  . Diabetes Paternal Grandmother   . Diabetes Paternal Grandfather   . Colon cancer Unknown        great grandmother   Social History   Social History  . Marital status: Married    Spouse name: N/A  . Number of children: 4  . Years of education: N/A   Social History Main Topics  . Smoking status: Never Smoker  . Smokeless tobacco: Never Used  . Alcohol use 0.0 oz/week     Comment: rarely  . Drug use: No  . Sexual activity: Not Asked    Other Topics Concern  . None   Social History Narrative  . None    Outpatient Encounter Prescriptions as of 05/30/2017  Medication Sig  . cyanocobalamin (,VITAMIN B-12,) 1000 MCG/ML injection Inject 1 mL (1,000 mcg total) into the muscle every 30 (thirty) days. Use needles and syringes for administration.  Marland Kitchen estradiol (ESTRACE) 1 MG tablet Take 1 tablet (1 mg total) by mouth daily.  Marland Kitchen levothyroxine (SYNTHROID, LEVOTHROID) 50 MCG tablet TAKE 1 TABLET DAILY IN THE MORNING ON AN EMPTY STOMACH  . SYRINGE-NEEDLE, DISP, 3 ML 25G X 1" 3 ML MISC Use one syringe/needle set for each B12 administration once a month.  . [DISCONTINUED] cyanocobalamin (,VITAMIN B-12,) 1000 MCG/ML injection Inject 1 mL (1,000 mcg total) into the muscle every 30 (thirty) days. Use needles and syringes for administration.  . [DISCONTINUED] estradiol (ESTRACE) 1 MG tablet TAKE 1 TABLET DAILY  . [DISCONTINUED] levothyroxine (SYNTHROID, LEVOTHROID) 50 MCG tablet TAKE 1 TABLET DAILY IN THE MORNING ON AN EMPTY STOMACH  . [DISCONTINUED] SYRINGE-NEEDLE, DISP, 3 ML 25G X 1" 3 ML MISC Use one syringe/needle set **Note De-Identified Benak [facility]bf[facility]ation** for each B12 administration once a month.  . Tdap (BOOSTRIX) 5-2.5-18.5 LF-MCG/0.5 injection Inject 0.5 mLs into the muscle once.   No facility-administered encounter medications on file as of 05/30/2017.     Review of Systems  Constitutional: Negative for appetite change.       Has lost weight.  Feels better.   HENT: Negative for congestion and sinus pressure.   Respiratory: Negative for cough, chest tightness and shortness of breath.   Cardiovascular: Negative for chest pain, palpitations and leg swelling.  Gastrointestinal: Negative for abdominal pain, diarrhea, nausea and vomiting.       Bowels moving better.   Genitourinary: Negative for difficulty urinating and dysuria.  Musculoskeletal: Negative for joint swelling.       Right wrist and hand pain as outlined.    Skin: Negative for color change and rash.    Neurological: Negative for dizziness, light-headedness and headaches.  Psychiatric/Behavioral: Negative for agitation and dysphoric mood.       Objective:    Physical Exam  Constitutional: She appears well-developed and well-nourished. No distress.  HENT:  Nose: Nose normal.  Mouth/Throat: Oropharynx is clear and moist.  Neck: Neck supple. No thyromegaly present.  Cardiovascular: Normal rate and regular rhythm.   Pulmonary/Chest: Breath sounds normal. No respiratory distress. She has no wheezes.  Abdominal: Soft. Bowel sounds are normal. There is no tenderness.  Musculoskeletal: She exhibits no edema or tenderness.  Able to flex and extend without significant pain.  Some increased pain with palpation over her right forearm.  Grip - normal.    Lymphadenopathy:    She has no cervical adenopathy.  Skin: No rash noted. No erythema.  Psychiatric: She has a normal mood and affect. Her behavior is normal.    BP 106/64 (BP Location: Left Arm, Patient Position: Sitting, Cuff Size: Normal)   Pulse 64   Temp 98.6 F (37 C) (Oral)   Resp 12   Ht 5\' 5"  (1.651 m)   Wt 134 lb 12.8 oz (61.1 kg)   SpO2 98%   BMI 22.43 kg/m  Wt Readings from Last 3 Encounters:  05/30/17 134 lb 12.8 oz (61.1 kg)  11/29/16 147 lb (66.7 kg)  11/04/16 143 lb 12.8 oz (65.2 kg)     Lab Results  Component Value Date   WBC 6.3 04/07/2016   HGB 12.7 04/07/2016   HCT 37.5 04/07/2016   PLT 208.0 04/07/2016   GLUCOSE 93 02/23/2016   CHOL 177 01/29/2014   TRIG 60.0 01/29/2014   HDL 42.70 01/29/2014   LDLCALC 122 (H) 01/29/2014   ALT 9 02/23/2016   AST 10 02/23/2016   NA 139 02/23/2016   K 4.1 02/23/2016   CL 105 02/23/2016   CREATININE 0.78 02/23/2016   BUN 17 02/23/2016   CO2 28 02/23/2016   TSH 1.87 07/15/2016    US Soft Tissue Head/neck  Result Date: 07/03/2015 CLINICAL DATA:  Single thyroid nodule EXAM: THYROID ULTRASOUND TECHNIQUE: Ultrasound examination of the thyroid gland and adjacent soft  tissues was performed. COMPARISON:  11/11/2014, 08/27/2014 FINDINGS: Right thyroid lobe Measurements: 4.4 x 1.3 x 1.7 cm. 1.1 cm hypoechoic nodule is again noted within the midportion of the right lobe of the thyroid. This has been previously biopsied. Left thyroid lobe Measurements: 4.0 x 1.5 x 1.6 cm.  No nodules visualized. Isthmus Thickness: 2 mm.  No nodules visualized. Lymphadenopathy None visualized. IMPRESSION: Stable right thyroid nodule.  No new focal abnormality is noted. Electronically Signed   By: Elta Guadeloupe **Note De-Identified [place] [facility]** Lukens M.D.   On: 07/03/2015 10:01       Assessment & Plan:   Problem List Items Addressed This Visit    B12 deficiency - Primary    Continue B12 injections.  Check B12 level today.        Relevant Orders   Vitamin B12   Change in bowel habits    Improved.  Saw Dr Vicente Males.  Prefer to postpone colonoscopy.        Estrogen deficiency    S/p TAH/BSO.  Started on estrogen by gyn.  Remains on oral estrogen.  Follow.       Hypothyroidism    On thyroid replacement.  Follow tsh.       Relevant Medications   levothyroxine (SYNTHROID, LEVOTHROID) 50 MCG tablet   Other Relevant Orders   CBC with Differential/Platelet   TSH   Right thyroid nodule    S/p biopsy.  Has seen Dr Harlow Asa.  Check tsh today.       Relevant Medications   levothyroxine (SYNTHROID, LEVOTHROID) 50 MCG tablet    Other Visit Diagnoses    Right wrist pain       Persistent s/p injury.  will hold on xray.  question of soft tissue injury.  with numbness and electric shock noticed as well.  refer to ortho.    Relevant Orders   Ambulatory referral to Orthopedic Surgery       Einar Pheasant, MD

## 2017-05-30 NOTE — Assessment & Plan Note (Signed)
**Note De-Identified [place] [facility]** S/p TAH/BSO.  Started on estrogen by gyn.  Remains on oral estrogen.  Follow.

## 2017-05-30 NOTE — Assessment & Plan Note (Signed)
**Note De-identified Galindo [facility]** On thyroid replacement.  Follow tsh.  

## 2017-05-30 NOTE — Assessment & Plan Note (Signed)
**Note De-Identified [place] [facility]** Improved.  Saw Dr Vicente Males.  Prefer to postpone colonoscopy.

## 2017-05-30 NOTE — Progress Notes (Signed)
**Note De-identified Mcnerney [facility]** Order placed for f/u labs.  

## 2017-05-30 NOTE — Progress Notes (Signed)
**Note De-identified Nylund [facility]** Pre-visit discussion using our clinic review tool. No additional management support is needed unless otherwise documented below in the visit note.  

## 2017-05-30 NOTE — Assessment & Plan Note (Signed)
**Note De-Identified [place] [facility]** Continue B12 injections.  Check B12 level today.

## 2017-05-30 NOTE — Assessment & Plan Note (Signed)
**Note De-Identified Richert [facility]** S/p biopsy.  Has seen Dr Harlow Asa.  Check tsh today.

## 2017-06-23 ENCOUNTER — Other Ambulatory Visit (INDEPENDENT_AMBULATORY_CARE_PROVIDER_SITE_OTHER): Payer: BLUE CROSS/BLUE SHIELD

## 2017-06-23 DIAGNOSIS — D649 Anemia, unspecified: Secondary | ICD-10-CM | POA: Diagnosis not present

## 2017-06-23 LAB — CBC WITH DIFFERENTIAL/PLATELET
BASOS ABS: 0 10*3/uL (ref 0.0–0.1)
Basophils Relative: 0.6 % (ref 0.0–3.0)
EOS ABS: 0.2 10*3/uL (ref 0.0–0.7)
Eosinophils Relative: 2.3 % (ref 0.0–5.0)
HEMATOCRIT: 37.2 % (ref 36.0–46.0)
Hemoglobin: 12.6 g/dL (ref 12.0–15.0)
LYMPHS PCT: 32.4 % (ref 12.0–46.0)
Lymphs Abs: 2.2 10*3/uL (ref 0.7–4.0)
MCHC: 33.8 g/dL (ref 30.0–36.0)
MCV: 91.3 fl (ref 78.0–100.0)
MONOS PCT: 5.7 % (ref 3.0–12.0)
Monocytes Absolute: 0.4 10*3/uL (ref 0.1–1.0)
Neutro Abs: 4 10*3/uL (ref 1.4–7.7)
Neutrophils Relative %: 59 % (ref 43.0–77.0)
PLATELETS: 183 10*3/uL (ref 150.0–400.0)
RBC: 4.08 Mil/uL (ref 3.87–5.11)
RDW: 12.6 % (ref 11.5–15.5)
WBC: 6.9 10*3/uL (ref 4.0–10.5)

## 2017-06-23 LAB — IBC PANEL
IRON: 71 ug/dL (ref 42–145)
SATURATION RATIOS: 23.9 % (ref 20.0–50.0)
TRANSFERRIN: 212 mg/dL (ref 212.0–360.0)

## 2017-06-23 LAB — FERRITIN: Ferritin: 87.2 ng/mL (ref 10.0–291.0)

## 2017-07-18 ENCOUNTER — Telehealth: Payer: Self-pay

## 2017-07-18 NOTE — Telephone Encounter (Signed)
**Note De-Identified [place]ong [facility]** Claiborne Billings called from Dr. Harlow Asa they will have patent scheduled for u/s and f/u with their office. Per request from [facility] I have faxed over last office note and TSH to [phone]. They will let patient know of app information.

## 2017-07-19 ENCOUNTER — Other Ambulatory Visit: Payer: Self-pay | Admitting: Surgery

## 2017-07-19 DIAGNOSIS — D440 Neoplasm of uncertain behavior of thyroid gland: Secondary | ICD-10-CM

## 2017-07-22 ENCOUNTER — Ambulatory Visit
Admission: RE | Admit: 2017-07-22 | Discharge: 2017-07-22 | Disposition: A | Discharge status: Home / Self Care | Payer: BLUE CROSS/BLUE SHIELD | Source: Ambulatory Visit | Attending: Surgery | Admitting: Surgery

## 2017-07-22 DIAGNOSIS — D440 Neoplasm of uncertain behavior of thyroid gland: Secondary | ICD-10-CM

## 2017-07-22 DIAGNOSIS — E041 Nontoxic single thyroid nodule: Secondary | ICD-10-CM | POA: Diagnosis not present

## 2017-08-08 ENCOUNTER — Telehealth: Payer: Self-pay | Admitting: Internal Medicine

## 2017-08-08 MED ORDER — "SYRINGE/NEEDLE (DISP) 25G X 1"" 3 ML MISC": 1 refills | Status: DC

## 2017-08-08 NOTE — Telephone Encounter (Signed)
**Note De-identified K[place] [facility]** Refill sent to mail order. 

## 2017-08-08 NOTE — Telephone Encounter (Signed)
**Note De-Identified [place] [facility]** Pt called and stated that she needs her syringes for the b12 injections. Please advise, thank you!  Pharmacy - [facility], [place]

## 2017-08-08 NOTE — Addendum Note (Signed)
**Note De-Identified [place] [facility]** Addended by: Francella Solian on: 08/08/2017 01:37 PM   Modules accepted: Orders

## 2017-08-18 ENCOUNTER — Other Ambulatory Visit: Payer: Self-pay | Admitting: Internal Medicine

## 2017-08-25 ENCOUNTER — Other Ambulatory Visit: Payer: Self-pay

## 2017-08-25 MED ORDER — "SYRINGE/NEEDLE (DISP) 25G X 1"" 3 ML MISC": 1 refills | Status: DC

## 2017-08-25 NOTE — Telephone Encounter (Signed)
**Note De-Identified [place] [facility]** SYRINGE-NEEDLE, DISP, 3 ML 25G X 1" 3 ML MISC  The patients needles are on back order. Can we call in a replacement needle to [facility] at [address] The patient will be out of needles in a couple of days.

## 2017-08-25 NOTE — Telephone Encounter (Signed)
**Note De-Identified [place] [facility]** Script sent to [facility] at [address]

## 2017-09-04 ENCOUNTER — Other Ambulatory Visit: Payer: Self-pay | Admitting: Internal Medicine

## 2017-09-11 DIAGNOSIS — N3090 Cystitis, unspecified without hematuria: Secondary | ICD-10-CM | POA: Diagnosis not present

## 2017-09-11 DIAGNOSIS — R300 Dysuria: Secondary | ICD-10-CM | POA: Diagnosis not present

## 2017-09-12 ENCOUNTER — Other Ambulatory Visit: Payer: Self-pay | Admitting: Internal Medicine

## 2018-02-14 ENCOUNTER — Other Ambulatory Visit: Payer: Self-pay | Admitting: Internal Medicine

## 2018-06-13 ENCOUNTER — Other Ambulatory Visit: Payer: Self-pay | Admitting: Internal Medicine

## 2018-07-06 ENCOUNTER — Telehealth: Payer: Self-pay

## 2018-07-06 NOTE — Telephone Encounter (Signed)
**Note De-Identified [place] [facility]** Copied from [place] [phone]. Topic: Appointment Scheduling - Scheduling Inquiry for Clinic >> Jul 06, 2018  4:16 PM Sheran Luz wrote: Reason for CRM: Pt called requesting that she be worked in on wed 9/18 for an appointment for [place] with Dr. 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 She is requesting a call back at [phone]

## 2018-07-07 NOTE — Telephone Encounter (Signed)
**Note De-Identified [place] [facility]** Called and scheduled pt for 10am on 07/12/2018

## 2018-07-12 ENCOUNTER — Encounter: Payer: Self-pay | Admitting: Internal Medicine

## 2018-07-12 ENCOUNTER — Ambulatory Visit: Payer: BLUE CROSS/BLUE SHIELD | Admitting: Internal Medicine

## 2018-07-12 DIAGNOSIS — R5383 Other fatigue: Secondary | ICD-10-CM

## 2018-07-12 DIAGNOSIS — E2839 Other primary ovarian failure: Secondary | ICD-10-CM

## 2018-07-12 DIAGNOSIS — E041 Nontoxic single thyroid nodule: Secondary | ICD-10-CM | POA: Diagnosis not present

## 2018-07-12 DIAGNOSIS — E038 Other specified hypothyroidism: Secondary | ICD-10-CM

## 2018-07-12 DIAGNOSIS — E538 Deficiency of other specified B group vitamins: Secondary | ICD-10-CM | POA: Diagnosis not present

## 2018-07-12 DIAGNOSIS — Z13220 Encounter for screening for lipoid disorders: Secondary | ICD-10-CM | POA: Diagnosis not present

## 2018-07-12 LAB — BASIC METABOLIC PANEL
BUN: 16 mg/dL (ref 6–23)
CHLORIDE: 104 meq/L (ref 96–112)
CO2: 30 meq/L (ref 19–32)
CREATININE: 0.79 mg/dL (ref 0.40–1.20)
Calcium: 9 mg/dL (ref 8.4–10.5)
GFR: 86.04 mL/min (ref >60.00)
Glucose, Bld: 86 mg/dL (ref 70–99)
POTASSIUM: 4.1 meq/L (ref 3.5–5.1)
Sodium: 138 mEq/L (ref 135–145)

## 2018-07-12 LAB — HEPATIC FUNCTION PANEL
ALT: 10 U/L (ref 0–35)
AST: 11 U/L (ref 0–37)
Albumin: 4.3 g/dL (ref 3.5–5.2)
Alkaline Phosphatase: 34 U/L — ABNORMAL LOW (ref 39–117)
BILIRUBIN DIRECT: 0.1 mg/dL (ref 0.0–0.3)
BILIRUBIN TOTAL: 0.9 mg/dL (ref 0.2–1.2)
Total Protein: 7.2 g/dL (ref 6.0–8.3)

## 2018-07-12 LAB — CBC WITH DIFFERENTIAL/PLATELET
BASOS ABS: 0 10*3/uL (ref 0.0–0.1)
Basophils Relative: 0.8 % (ref 0.0–3.0)
EOS ABS: 0.2 10*3/uL (ref 0.0–0.7)
EOS PCT: 4.2 % (ref 0.0–5.0)
HCT: 37.1 % (ref 36.0–46.0)
Hemoglobin: 12.9 g/dL (ref 12.0–15.0)
LYMPHS ABS: 2.2 10*3/uL (ref 0.7–4.0)
Lymphocytes Relative: 42.5 % (ref 12.0–46.0)
MCHC: 34.8 g/dL (ref 30.0–36.0)
MCV: 89.2 fl (ref 78.0–100.0)
MONO ABS: 0.3 10*3/uL (ref 0.1–1.0)
Monocytes Relative: 6.6 % (ref 3.0–12.0)
NEUTROS PCT: 45.9 % (ref 43.0–77.0)
Neutro Abs: 2.4 10*3/uL (ref 1.4–7.7)
PLATELETS: 192 10*3/uL (ref 150.0–400.0)
RBC: 4.16 Mil/uL (ref 3.87–5.11)
RDW: 12.6 % (ref 11.5–15.5)
WBC: 5.1 10*3/uL (ref 4.0–10.5)

## 2018-07-12 LAB — VITAMIN D 25 HYDROXY (VIT D DEFICIENCY, FRACTURES): VITD: 25 ng/mL — ABNORMAL LOW (ref 30.00–100.00)

## 2018-07-12 LAB — LIPID PANEL
CHOL/HDL RATIO: 3
CHOLESTEROL: 187 mg/dL (ref 0–200)
HDL: 53.9 mg/dL (ref >39.00)
LDL CALC: 120 mg/dL — AB (ref 0–99)
NonHDL: 133.19
TRIGLYCERIDES: 66 mg/dL (ref 0.0–149.0)
VLDL: 13.2 mg/dL (ref 0.0–40.0)

## 2018-07-12 LAB — TSH: TSH: 1.58 u[IU]/mL (ref 0.35–4.50)

## 2018-07-12 LAB — VITAMIN B12: VITAMIN B 12: 553 pg/mL (ref 211–911)

## 2018-07-12 LAB — FERRITIN: Ferritin: 102.6 ng/mL (ref 10.0–291.0)

## 2018-07-12 NOTE — Progress Notes (Signed)
**Note De-Identified [place] [facility]** Patient ID: Sandy Jefferson, female   DOB: 07-22-1981, 37 y.o.   MRN: [id number]   Subjective:    Patient ID: Sandy Jefferson, female    DOB: Aug 19, 1981, 37 y.o.   MRN: [id number]  HPI  Patient here as a work in with concerns regarding weight gain and fatigue.  She reports that symptoms started 03/2018.  Feels like she did when her thyroid was "off".  Some weight gain.  Can't sleep.  States her bones are aching.  Feels swollen in her neck.  No sore throat. Eating and swallowing ok.  No chest pain.  No sob.  No acid reflux.  No abdominal pain.  Bowels moving.     Past Medical History:  Diagnosis Date  . Bladder troubles   . Migraine headache    Past Surgical History:  Procedure Laterality Date  . CESAREAN SECTION    . HERNIA REPAIR    . HYSTEROSCOPY     Family History  Problem Relation Age of Onset  . Heart disease Brother   . Hyperlipidemia Mother   . Hyperlipidemia Father   . Cancer Maternal Aunt        breast  . Stroke Maternal Grandmother   . Hyperlipidemia Maternal Grandfather   . Cancer Paternal Grandmother        lung  . Diabetes Paternal Grandmother   . Diabetes Paternal Grandfather   . Colon cancer Unknown        great grandmother   Social History   Socioeconomic History  . Marital status: Married    Spouse name: Not on file  . Number of children: 4  . Years of education: Not on file  . Highest education level: Not on file  Occupational History  . Not on file  Social Needs  . Financial resource strain: Not on file  . Food insecurity:    Worry: Not on file    Inability: Not on file  . Transportation needs:    Medical: Not on file    Non-medical: Not on file  Tobacco Use  . Smoking status: Never Smoker  . Smokeless tobacco: Never Used  Substance and Sexual Activity  . Alcohol use: Yes    Alcohol/week: 0.0 standard drinks    Comment: rarely  . Drug use: No  . Sexual activity: Not on file  Lifestyle  . Physical activity:    Days per week: Not on file   Minutes per session: Not on file  . Stress: Not on file  Relationships  . Social connections:    Talks on phone: Not on file    Gets together: Not on file    Attends religious service: Not on file    Active member of club or organization: Not on file    Attends meetings of clubs or organizations: Not on file    Relationship status: Not on file  Other Topics Concern  . Not on file  Social History Narrative  . Not on file    Outpatient Encounter Medications as of 07/12/2018  Medication Sig  . cyanocobalamin (,VITAMIN B-12,) 1000 MCG/ML injection Inject 1 mL (1,000 mcg total) into the muscle every 30 (thirty) days. Use needles and syringes for administration.  Marland Kitchen estradiol (ESTRACE) 1 MG tablet TAKE 1 TABLET DAILY  . levothyroxine (SYNTHROID, LEVOTHROID) 50 MCG tablet TAKE 1 TABLET DAILY IN THE MORNING ON AN EMPTY STOMACH (OFFICE VISIT NEEDED FOR FURTHER REFILLS)  . SYRINGE-NEEDLE, DISP, 3 ML (B-D 3CC LUER-LOK SYR 25GX1") 25G X 1" **Note De-Identified Cronic Obfuscation** 3 ML MISC USE ONE SYRING/NEEDLE SET FOR EACH B12 ADMINISTRATION ONCE A MONTH  . [DISCONTINUED] SYRINGE-NEEDLE, DISP, 3 ML 25G X 1" 3 ML MISC Use one syringe/needle set for each B12 administration once a month.   No facility-administered encounter medications on file as of 07/12/2018.     Review of Systems  Constitutional: Positive for fatigue. Negative for appetite change.       Weight gain.   HENT: Negative for congestion and sinus pressure.   Respiratory: Negative for cough, chest tightness and shortness of breath.   Cardiovascular: Negative for chest pain, palpitations and leg swelling.  Gastrointestinal: Negative for abdominal pain, diarrhea, nausea and vomiting.  Genitourinary: Negative for difficulty urinating and dysuria.  Musculoskeletal: Negative for joint swelling.       Bone aching as outlined.    Skin: Negative for color change and rash.  Neurological: Negative for dizziness, light-headedness and headaches.  Psychiatric/Behavioral: Negative for  agitation and dysphoric mood.       Objective:    Physical Exam  Constitutional: She appears well-developed and well-nourished. No distress.  HENT:  Nose: Nose normal.  Mouth/Throat: Oropharynx is clear and moist.  Neck: Neck supple.  Cardiovascular: Normal rate and regular rhythm.  Pulmonary/Chest: Breath sounds normal. No respiratory distress. She has no wheezes.  Abdominal: Soft. Bowel sounds are normal. There is no tenderness.  Musculoskeletal: She exhibits no edema or tenderness.  Lymphadenopathy:    She has no cervical adenopathy.  Skin: No rash noted. No erythema.  Psychiatric: She has a normal mood and affect. Her behavior is normal.    BP 112/70 (BP Location: Left Arm, Patient Position: Sitting, Cuff Size: Normal)   Pulse 72   Temp 97.9 F (36.6 C) (Oral)   Resp 18   Wt 147 lb 12.8 oz (67 kg)   SpO2 98%   BMI 24.60 kg/m  Wt Readings from Last 3 Encounters:  07/12/18 147 lb 12.8 oz (67 kg)  05/30/17 134 lb 12.8 oz (61.1 kg)  11/29/16 147 lb (66.7 kg)     Lab Results  Component Value Date   WBC 5.1 07/12/2018   HGB 12.9 07/12/2018   HCT 37.1 07/12/2018   PLT 192.0 07/12/2018   GLUCOSE 86 07/12/2018   CHOL 187 07/12/2018   TRIG 66.0 07/12/2018   HDL 53.90 07/12/2018   LDLCALC 120 (H) 07/12/2018   ALT 10 07/12/2018   AST 11 07/12/2018   NA 138 07/12/2018   K 4.1 07/12/2018   CL 104 07/12/2018   CREATININE 0.79 07/12/2018   BUN 16 07/12/2018   CO2 30 07/12/2018   TSH 1.58 07/12/2018    US Thyroid  Result Date: 07/22/2017 CLINICAL DATA:  Prior ultrasound follow-up. History of right-sided thyroid nodule fine-needle aspiration. EXAM: THYROID ULTRASOUND TECHNIQUE: Ultrasound examination of the thyroid gland and adjacent soft tissues was performed. COMPARISON:  Thyroid ultrasound - 07/02/2015 ; 08/27/2014; ultrasound-guided right-sided thyroid nodule fine-needle aspiration -11/11/2014 FINDINGS: Parenchymal Echotexture: Mildly heterogenous Isthmus: Normal  in size measuring 1 mm in diameter, unchanged Right lobe: Slightly diminutive in size measuring 4.2 x 1.3 x 1.5 cm, unchanged, previously, 4.4 x 1.3 x 1.5 cm Left lobe: Slightly diminutive in size measuring 3.9 x 1.1 x 1.2 cm, unchanged, previously, 4.0 x 1.6 x 1.5 cm. _________________________________________________________ Estimated total number of nodules >/= 1 cm: 0 Number of spongiform nodules >/=  2 cm not described below (TR1): 0 Number of mixed cystic and solid nodules >/= 1.5 cm not described below ([place]): 0 **Note De-Identified Buren Obfuscation** _________________________________________________________ The solitary punctate (approximately 0.6 x 0.4 x 0.2 cm hypoechoic nodule within the mid pole of the right lobe of the thyroid has decreased in size compared to the 08/2014 examination, previously, 1.1 x 0.6 x 0.9 cm. Correlation with prior biopsy results is recommended. IMPRESSION: 1. No new or enlarging thyroid nodules. 2. The previously biopsied solitary right mid pole thyroid nodule has decreased in size compared to the 08/2014 examination, currently measuring 0.6 cm, previously, 1.1 cm. Correlation with prior biopsy results is recommended. Assuming a benign pathologic diagnosis, repeat sampling and/or continued dedicated follow-up is not recommended. The above is in keeping with the ACR TI-RADS recommendations - J Am Coll Radiol 2017;14:587-595. Electronically Signed   By: Sandi Mariscal M.D.   On: 07/22/2017 16:30       Assessment & Plan:   Problem List Items Addressed This Visit    B12 deficiency    Recheck B12 level.       Relevant Orders   Vitamin B12 (Completed)   Estrogen deficiency    S/p TAH/BSO.  Started on estrogen by gyn.        Fatigue    Increased fatigue as outlined.  Recheck tsh.  Also check B12 level, cbc and iron stores.        Relevant Orders   CBC with Differential/Platelet (Completed)   Ferritin (Completed)   Hepatic function panel (Completed)   VITAMIN D 25 Hydroxy (Vit-D Deficiency, Fractures)  (Completed)   Basic metabolic panel (Completed)   Hypothyroidism    On thyroid replacement.  Follow tsh.        Right thyroid nodule    S/p biopsy - abnormal.  See report.  Previously saw Dr Harlow Asa.  On synthroid.  Check tsh.  Get her in with endocrinology for continued f/u.       Relevant Orders   TSH (Completed)   Ambulatory referral to Endocrinology    Other Visit Diagnoses    Screening cholesterol level       Relevant Orders   Lipid panel (Completed)       Einar Pheasant, MD

## 2018-07-13 ENCOUNTER — Encounter: Payer: Self-pay | Admitting: Internal Medicine

## 2018-07-15 ENCOUNTER — Encounter: Payer: Self-pay | Admitting: Internal Medicine

## 2018-07-15 NOTE — Assessment & Plan Note (Signed)
**Note De-Identified Sandy Jefferson** S/p biopsy - abnormal.  See report.  Previously saw Dr Harlow Asa.  On synthroid.  Check tsh.  Get her in with endocrinology for continued f/u.

## 2018-07-15 NOTE — Assessment & Plan Note (Signed)
**Note De-Identified Deveney Obfuscation** Increased fatigue as outlined.  Recheck tsh.  Also check B12 level, cbc and iron stores.

## 2018-07-15 NOTE — Assessment & Plan Note (Signed)
**Note De-identified Widger Obfuscation** Recheck B12 level 

## 2018-07-15 NOTE — Assessment & Plan Note (Signed)
**Note De-identified Kowal Obfuscation** On thyroid replacement.  Follow tsh.  

## 2018-07-15 NOTE — Assessment & Plan Note (Signed)
**Note De-identified Gottwald Obfuscation** S/p TAH/BSO.  Started on estrogen by gyn.   

## 2018-07-20 ENCOUNTER — Encounter: Payer: Self-pay | Admitting: Internal Medicine

## 2018-07-31 DIAGNOSIS — E039 Hypothyroidism, unspecified: Secondary | ICD-10-CM | POA: Diagnosis not present

## 2018-07-31 DIAGNOSIS — E041 Nontoxic single thyroid nodule: Secondary | ICD-10-CM | POA: Diagnosis not present

## 2018-08-02 ENCOUNTER — Telehealth: Payer: Self-pay | Admitting: Internal Medicine

## 2018-08-02 NOTE — Telephone Encounter (Signed)
**Note De-Identified Mallozzi Obfuscation** Copied from [place] [phone]. Topic: Referral - Medical Records >> Aug 02, 2018  1:24 PM Lennox Solders wrote: Reason for CRM: Nate at [facility] endocrinologist needs  a copy of thyroid pathology report fax to [phone] and phone [phone]

## 2018-08-03 ENCOUNTER — Encounter: Payer: Self-pay | Admitting: *Deleted

## 2018-08-03 NOTE — Telephone Encounter (Signed)
**Note De-Identified Iglesia Obfuscation** Report not found in epic or care everywhere. I have reached out to the patient to get more information [facility] mychart.

## 2018-08-03 NOTE — Telephone Encounter (Signed)
**Note De-Identified Tomassetti Obfuscation** I have also sent a request for report to [facility].

## 2018-08-08 NOTE — Telephone Encounter (Signed)
**Note De-Identified Nordmann Obfuscation** Found path report in Media file & sent it electronically. Pt also notified [facility] mychart.

## 2018-08-13 ENCOUNTER — Other Ambulatory Visit: Payer: Self-pay | Admitting: Internal Medicine

## 2018-08-15 ENCOUNTER — Encounter: Payer: Self-pay | Admitting: Internal Medicine

## 2018-08-16 ENCOUNTER — Other Ambulatory Visit: Payer: Self-pay

## 2018-08-16 MED ORDER — CYANOCOBALAMIN 1000 MCG/ML IJ SOLN: 1000.0000 ug | INTRAMUSCULAR | 1 refills | Status: DC

## 2018-08-16 MED ORDER — ESTRADIOL 1 MG PO TABS: 1.0000 mg | ORAL_TABLET | Freq: Every day | ORAL | 1 refills | Status: DC

## 2018-08-16 MED ORDER — "SYRINGE/NEEDLE (DISP) 25G X 1"" 3 ML MISC": 1 refills | Status: DC

## 2018-08-17 DIAGNOSIS — E041 Nontoxic single thyroid nodule: Secondary | ICD-10-CM | POA: Diagnosis not present

## 2018-09-04 ENCOUNTER — Ambulatory Visit: Payer: Self-pay | Admitting: *Deleted

## 2018-09-04 ENCOUNTER — Ambulatory Visit: Payer: BLUE CROSS/BLUE SHIELD | Admitting: Family Medicine

## 2018-09-04 ENCOUNTER — Encounter: Payer: Self-pay | Admitting: Family Medicine

## 2018-09-04 VITALS — BP 118/86 | HR 73 | Temp 97.8°F | Ht 67.0 in | Wt 151.8 lb

## 2018-09-04 DIAGNOSIS — R059 Cough, unspecified: Secondary | ICD-10-CM

## 2018-09-04 DIAGNOSIS — R05 Cough: Secondary | ICD-10-CM | POA: Diagnosis not present

## 2018-09-04 DIAGNOSIS — R0982 Postnasal drip: Secondary | ICD-10-CM

## 2018-09-04 MED ORDER — PSEUDOEPHEDRINE-GUAIFENESIN ER 60-600 MG PO TB12: 1.0000 | ORAL_TABLET | Freq: Two times a day (BID) | ORAL | 0 refills | Status: DC

## 2018-09-04 MED ORDER — METHYLPREDNISOLONE 4 MG PO TBPK: ORAL_TABLET | ORAL | 0 refills | Status: DC

## 2018-09-04 NOTE — Patient Instructions (Addendum)
**Note De-Identified Cashatt Obfuscation** Start claritin 10 mg per day  Take mucinex D 2 times per day & steroid taper to calm cough  If not better in one week, we can do CXR

## 2018-09-04 NOTE — Progress Notes (Signed)
**Note De-Identified Perreira Obfuscation** Subjective:    Patient ID: Sandy Jefferson, female    DOB: Feb 26, 1981, 37 y.o.   MRN: [id number]  HPI  Patient presents to clinic c/o cough for 1 week.  Patient states cough mainly is dry, does cough up some white phlegm at times, phlegm seems to be thicker in the morning.  Patient feels a tickle in the back of her throat.  Denies feeling short of breath or any wheezing.  Denies fever or chills.  Took some Alka-Seltzer cold and flu, but this did not help.  Patient is not a smoker, has no history of any sort of breathing issues.   Patient Active Problem List   Diagnosis Date Noted  . Hypothyroidism 04/24/2016  . B12 deficiency 04/24/2016  . Fatigue 02/23/2016  . Joint ache 02/23/2016  . Health care maintenance 11/30/2015  . Change in bowel habits 09/28/2015  . Rash and nonspecific skin eruption 12/03/2014  . Sinusitis, acute 12/03/2014  . Right thyroid nodule 10/08/2014  . Estrogen deficiency 08/04/2014  . Back pain 02/03/2014  . Irregular menstrual bleeding 01/28/2013   Social History   Tobacco Use  . Smoking status: Never Smoker  . Smokeless tobacco: Never Used  Substance Use Topics  . Alcohol use: Yes    Alcohol/week: 0.0 standard drinks    Comment: rarely     Review of Systems  Constitutional: Negative for chills, fatigue and fever.  HENT: Negative for congestion, ear pain, sinus pain and sore throat. "congested in throat" Eyes: Negative.   Respiratory: +cough, some white phlegm at times. Negative shortness of breath and wheezing.   Cardiovascular: Negative for chest pain, palpitations and leg swelling.  Gastrointestinal: Negative for abdominal pain, diarrhea, nausea and vomiting.  Genitourinary: Negative for dysuria, frequency and urgency.  Musculoskeletal: Negative for arthralgias and myalgias.  Skin: Negative for color change, pallor and rash.  Neurological: Negative for syncope, light-headedness and headaches.  Psychiatric/Behavioral: The patient is not  nervous/anxious.       Objective:   Physical Exam  Constitutional: She is oriented to person, place, and time. She appears well-nourished. No distress.  HENT:  Head: Normocephalic and atraumatic.  Right Ear: Tympanic membrane, external ear and ear canal normal.  Left Ear: Tympanic membrane, external ear and ear canal normal.  Nose: Mucosal edema present.  +cobblestoning pattern on back of throat consistent with post nasal drip.   Eyes: Conjunctivae and EOM are normal. No scleral icterus.  Neck: Neck supple. No tracheal deviation present.  Cardiovascular: Normal rate, regular rhythm and normal heart sounds.  Pulmonary/Chest: Effort normal and breath sounds normal. No stridor. No respiratory distress. She has no wheezes. She has no rales.  Musculoskeletal: She exhibits no edema.  Gait normal.   Neurological: She is alert and oriented to person, place, and time.  Skin: Skin is warm and dry. She is not diaphoretic. No pallor.  Psychiatric: She has a normal mood and affect. Her behavior is normal.  Nursing note and vitals reviewed.   Vitals:   09/04/18 1036  BP: 118/86  Pulse: 73  Temp: 97.8 F (36.6 C)  SpO2: 97%      Assessment & Plan:   Cough, postnasal drip - lungs are clear on exam, so I do not believe cough is related to respiratory issue.  Patient will begin Claritin 10 mg once per day.  Patient has supply of Claritin at home and does not require prescription.  She also will take Mucinex D 2 times per day for the next **Note De-Identified Kamel Obfuscation** 7 to 10 days to help improve cough and relieve any congestion.  Mucinex will help thin secretions and can help suppress cough.  Due to mucosal edema and feeling of tickle in back of throat she will also do steroid taper to calm down any inflammation.  Patient denied discussed that if her symptoms do not improve by next week we can consider chest x-ray for further evaluation.  Keep regularly scheduled follow-up as planned.  If symptoms persist and worsen, call  office and we will get chest x-ray scheduled.

## 2018-09-04 NOTE — Telephone Encounter (Signed)
**Note De-Identified Betzold Obfuscation** The pt called with complaints of deep cough and chest pain that hurts when she coughs; she says that it hurts worse after she coughs; she says that upper chest is sore to the tough; she has a productive cough and is loosing her voice; these symptoms have been going on for a week; she also states that she had a thyroid scan and everytime she has something done related to her thyroid this happens;recommendations made per nurse triage protocol the pt normally sees Dr Nicki Reaper, [facility] but she has no availability today; pt offered and accepted appointment with Philis Nettle 09/04/18 at 35 per Judson Roch; will also route to office for notification of this upcoming appointment.   Reason for Disposition . [1] Continuous (nonstop) coughing interferes with work or school AND [2] no improvement using cough treatment per Care Advice  Answer Assessment - Initial Assessment Questions 1. ONSET: "When did the cough begin?"      08/27/18 2. SEVERITY: "How bad is the cough today?"      Moderate to severe 3. RESPIRATORY DISTRESS: "Describe your breathing."      Breathing ok 4. FEVER: "Do you have a fever?" If so, ask: "What is your temperature, how was it measured, and when did it start?"     Don't think so 5. SPUTUM: "Describe the color of your sputum" (clear, white, yellow, green)     Milky colored; coughed up hard white dime sized piece pm 09/03/18 6. HEMOPTYSIS: "Are you coughing up any blood?" If so ask: "How much?" (flecks, streaks, tablespoons, etc.)     no 7. CARDIAC HISTORY: "Do you have any history of heart disease?" (e.g., heart attack, congestive heart failure)      no 8. LUNG HISTORY: "Do you have any history of lung disease?"  (e.g., pulmonary embolus, asthma, emphysema)     no 9. PE RISK FACTORS: "Do you have a history of blood clots?" (or: recent major surgery, recent prolonged travel, bedridden)     no 10. OTHER SYMPTOMS: "Do you have any other symptoms?" (e.g., runny nose, wheezing, chest  pain)       Upper chest sore to touch 11. PREGNANCY: "Is there any chance you are pregnant?" "When was your last menstrual period?"       No hysterectomy 12. TRAVEL: "Have you traveled out of the country in the last month?" (e.g., travel history, exposures)       no  Protocols used: [place]

## 2018-09-04 NOTE — Telephone Encounter (Signed)
**Note De-identified Haughey Obfuscation** Just a FYI

## 2018-09-04 NOTE — Telephone Encounter (Signed)
**Note De-Identified Clack Obfuscation** The pt called with complaints of deep cough and chest pain that hurts when she coughs; she says that it hurts worse after she coughs; she says that upper chest is sore to the tough; she has a productive cough and is loosing her voice; these symptoms have been going on for a week; she also states that she had a thyroid scan and everytime she has something done related to her thyroid this happens;recommendations made per nurse triage protocol the pt normally sees Dr Nicki Reaper, [facility] but she has no availability today; pt offered and accepted appointment with Philis Nettle 09/04/18 at 33 per Judson Roch; will also route to office for notification of this upcoming appointment.

## 2018-10-27 ENCOUNTER — Ambulatory Visit (INDEPENDENT_AMBULATORY_CARE_PROVIDER_SITE_OTHER): Payer: BLUE CROSS/BLUE SHIELD | Admitting: Internal Medicine

## 2018-10-27 VITALS — BP 120/64 | HR 69 | Temp 98.5°F | Resp 16 | Ht 67.0 in | Wt 155.2 lb

## 2018-10-27 DIAGNOSIS — Z1239 Encounter for other screening for malignant neoplasm of breast: Secondary | ICD-10-CM

## 2018-10-27 DIAGNOSIS — E038 Other specified hypothyroidism: Secondary | ICD-10-CM

## 2018-10-27 DIAGNOSIS — R05 Cough: Secondary | ICD-10-CM

## 2018-10-27 DIAGNOSIS — E041 Nontoxic single thyroid nodule: Secondary | ICD-10-CM | POA: Diagnosis not present

## 2018-10-27 DIAGNOSIS — E538 Deficiency of other specified B group vitamins: Secondary | ICD-10-CM

## 2018-10-27 DIAGNOSIS — R059 Cough, unspecified: Secondary | ICD-10-CM

## 2018-10-27 DIAGNOSIS — Z0000 Encounter for general adult medical examination without abnormal findings: Secondary | ICD-10-CM | POA: Diagnosis not present

## 2018-10-27 NOTE — Patient Instructions (Signed)
**Note De-identified Stubbe Obfuscation** pepcid 20mg - take one tablet 30 minutes before breakfast 

## 2018-10-27 NOTE — Progress Notes (Signed)
**Note De-Identified Belasco Obfuscation** Patient ID: Sandy Jefferson, female   DOB: 1980/11/25, 38 y.o.   MRN: [id number]   Subjective:    Patient ID: Sandy Jefferson, female    DOB: Nov 22, 1980, 38 y.o.   MRN: [id number]  HPI  Patient here for her physical exam.  She reports she has noticed some persistent issues with some intermittent cough.  States she has a taste of copper.  Describes some symptoms associated with eating.  Some acid reflux. No chest congestion.  No sob.  Saw endocrinology regarding her thyroid.  No chest pain.  No abdominal pain.  Bowels moving.  She is concerned regarding weight gain.  Some fatigue.  Feels the vitamin D has helped.  Some headaches at times.  Overall headaches have been doing better.     Past Medical History:  Diagnosis Date  . Bladder troubles   . Migraine headache    Past Surgical History:  Procedure Laterality Date  . CESAREAN SECTION    . HERNIA REPAIR    . HYSTEROSCOPY     Family History  Problem Relation Age of Onset  . Heart disease Brother   . Hyperlipidemia Mother   . Hyperlipidemia Father   . Cancer Maternal Aunt        breast  . Stroke Maternal Grandmother   . Hyperlipidemia Maternal Grandfather   . Cancer Paternal Grandmother        lung  . Diabetes Paternal Grandmother   . Diabetes Paternal Grandfather   . Colon cancer Other        great grandmother   Social History   Socioeconomic History  . Marital status: Married    Spouse name: Not on file  . Number of children: 4  . Years of education: Not on file  . Highest education level: Not on file  Occupational History  . Not on file  Social Needs  . Financial resource strain: Not on file  . Food insecurity:    Worry: Not on file    Inability: Not on file  . Transportation needs:    Medical: Not on file    Non-medical: Not on file  Tobacco Use  . Smoking status: Never Smoker  . Smokeless tobacco: Never Used  Substance and Sexual Activity  . Alcohol use: Yes    Alcohol/week: 0.0 standard drinks    Comment:  rarely  . Drug use: No  . Sexual activity: Not on file  Lifestyle  . Physical activity:    Days per week: Not on file    Minutes per session: Not on file  . Stress: Not on file  Relationships  . Social connections:    Talks on phone: Not on file    Gets together: Not on file    Attends religious service: Not on file    Active member of club or organization: Not on file    Attends meetings of clubs or organizations: Not on file    Relationship status: Not on file  Other Topics Concern  . Not on file  Social History Narrative  . Not on file    Outpatient Encounter Medications as of 10/27/2018  Medication Sig  . cyanocobalamin (,VITAMIN B-12,) 1000 MCG/ML injection Inject 1 mL (1,000 mcg total) into the muscle every 30 (thirty) days. Use needles and syringes for administration.  Marland Kitchen estradiol (ESTRACE) 1 MG tablet Take 1 tablet (1 mg total) by mouth daily.  Marland Kitchen levothyroxine (SYNTHROID, LEVOTHROID) 50 MCG tablet TAKE 1 TABLET DAILY IN THE MORNING ON AN **Note De-Identified Buerkle Obfuscation** EMPTY STOMACH (OFFICE VISIT NEEDED FOR FURTHER REFILLS)  . SYRINGE-NEEDLE, DISP, 3 ML (B-D 3CC LUER-LOK SYR 25GX1") 25G X 1" 3 ML MISC USE ONE SYRING/NEEDLE SET FOR EACH B12 ADMINISTRATION ONCE A MONTH  . [DISCONTINUED] methylPREDNISolone (MEDROL) 4 MG TBPK tablet Take according to pack instructions  . [DISCONTINUED] pseudoephedrine-guaifenesin (MUCINEX D) 60-600 MG 12 hr tablet Take 1 tablet by mouth every 12 (twelve) hours.   No facility-administered encounter medications on file as of 10/27/2018.     Review of Systems  Constitutional: Negative for appetite change and unexpected weight change.  HENT: Negative for congestion and sinus pressure.   Eyes: Negative for pain and visual disturbance.  Respiratory: Negative for cough, chest tightness and shortness of breath.   Cardiovascular: Negative for chest pain, palpitations and leg swelling.  Gastrointestinal: Negative for abdominal pain, diarrhea, nausea and vomiting.  Genitourinary:  Negative for difficulty urinating and dysuria.  Musculoskeletal: Negative for back pain and joint swelling.  Skin: Negative for color change and rash.  Neurological: Negative for dizziness and light-headedness.       No headache now.    Hematological: Negative for adenopathy. Does not bruise/bleed easily.  Psychiatric/Behavioral: Negative for agitation and dysphoric mood.       Objective:    Physical Exam Constitutional:      General: She is not in acute distress.    Appearance: Normal appearance. She is well-developed.  HENT:     Nose: Nose normal. No congestion.     Mouth/Throat:     Pharynx: No oropharyngeal exudate or posterior oropharyngeal erythema.  Eyes:     General: No scleral icterus.       Right eye: No discharge.        Left eye: No discharge.  Neck:     Musculoskeletal: Neck supple. No muscular tenderness.     Thyroid: No thyromegaly.  Cardiovascular:     Rate and Rhythm: Normal rate and regular rhythm.  Pulmonary:     Effort: No tachypnea, accessory muscle usage or respiratory distress.     Breath sounds: Normal breath sounds. No decreased breath sounds, wheezing or rhonchi.  Chest:     Breasts:        Right: No inverted nipple, mass, nipple discharge or tenderness (no axillary adenopathy).        Left: No inverted nipple, mass, nipple discharge or tenderness (no axilarry adenopathy).  Abdominal:     General: Bowel sounds are normal.     Palpations: Abdomen is soft.     Tenderness: There is no abdominal tenderness.  Musculoskeletal:        General: No swelling or tenderness.  Lymphadenopathy:     Cervical: No cervical adenopathy.  Skin:    Findings: No erythema or rash.  Neurological:     Mental Status: She is alert and oriented to person, place, and time.  Psychiatric:        Mood and Affect: Mood normal.        Behavior: Behavior normal.     BP 120/64 (BP Location: Left Arm, Patient Position: Sitting, Cuff Size: Normal)   Pulse 69   Temp 98.5 F  (36.9 C) (Oral)   Resp 16   Ht 5\' 7"  (1.702 m)   Wt 155 lb 3.2 oz (70.4 kg)   SpO2 98%   BMI 24.31 kg/m  Wt Readings from Last 3 Encounters:  10/27/18 155 lb 3.2 oz (70.4 kg)  09/04/18 151 lb 12.8 oz (68.9 kg)  07/12/18 147 lb **Note De-Identified Giancola Obfuscation** 12.8 oz (67 kg)     Lab Results  Component Value Date   WBC 5.1 07/12/2018   HGB 12.9 07/12/2018   HCT 37.1 07/12/2018   PLT 192.0 07/12/2018   GLUCOSE 86 07/12/2018   CHOL 187 07/12/2018   TRIG 66.0 07/12/2018   HDL 53.90 07/12/2018   LDLCALC 120 (H) 07/12/2018   ALT 10 07/12/2018   AST 11 07/12/2018   NA 138 07/12/2018   K 4.1 07/12/2018   CL 104 07/12/2018   CREATININE 0.79 07/12/2018   BUN 16 07/12/2018   CO2 30 07/12/2018   TSH 1.58 07/12/2018    US Thyroid  Result Date: 07/22/2017 CLINICAL DATA:  Prior ultrasound follow-up. History of right-sided thyroid nodule fine-needle aspiration. EXAM: THYROID ULTRASOUND TECHNIQUE: Ultrasound examination of the thyroid gland and adjacent soft tissues was performed. COMPARISON:  Thyroid ultrasound - 07/02/2015 ; 08/27/2014; ultrasound-guided right-sided thyroid nodule fine-needle aspiration -11/11/2014 FINDINGS: Parenchymal Echotexture: Mildly heterogenous Isthmus: Normal in size measuring 1 mm in diameter, unchanged Right lobe: Slightly diminutive in size measuring 4.2 x 1.3 x 1.5 cm, unchanged, previously, 4.4 x 1.3 x 1.5 cm Left lobe: Slightly diminutive in size measuring 3.9 x 1.1 x 1.2 cm, unchanged, previously, 4.0 x 1.6 x 1.5 cm. _________________________________________________________ Estimated total number of nodules >/= 1 cm: 0 Number of spongiform nodules >/=  2 cm not described below (TR1): 0 Number of mixed cystic and solid nodules >/= 1.5 cm not described below ([place]): 0 _________________________________________________________ The solitary punctate (approximately 0.6 x 0.4 x 0.2 cm hypoechoic nodule within the mid pole of the right lobe of the thyroid has decreased in size compared to the 08/2014  examination, previously, 1.1 x 0.6 x 0.9 cm. Correlation with prior biopsy results is recommended. IMPRESSION: 1. No new or enlarging thyroid nodules. 2. The previously biopsied solitary right mid pole thyroid nodule has decreased in size compared to the 08/2014 examination, currently measuring 0.6 cm, previously, 1.1 cm. Correlation with prior biopsy results is recommended. Assuming a benign pathologic diagnosis, repeat sampling and/or continued dedicated follow-up is not recommended. The above is in keeping with the ACR TI-RADS recommendations - J Am Coll Radiol 2017;14:587-595. Electronically Signed   By: Sandi Mariscal M.D.   On: 07/22/2017 16:30       Assessment & Plan:   Problem List Items Addressed This Visit    B12 deficiency    Continue b12 supplementation.  Follow.        Cough    Some cough with associated changes with eating - acid reflux.  Treat with pepcid.  Follow.  Call with update over the next few weeks.        Health care maintenance    Physical today 10/27/18.  Schedule mammogram.        Hypothyroidism    On thyroid replacement.  Follow tsh.        Right thyroid nodule    S/p biopsy.  Abnormal.  See report.  Previously saw Dr Harlow Asa.  On synthroid.  Follow tsh.  Saw endocrinology.         Other Visit Diagnoses    Routine general medical examination at a health care facility    -  Primary   Breast cancer screening       Relevant Orders   MM 3D SCREEN BREAST BILATERAL       Einar Pheasant, MD

## 2018-10-29 ENCOUNTER — Encounter: Payer: Self-pay | Admitting: Internal Medicine

## 2018-10-29 DIAGNOSIS — R059 Cough, unspecified: Secondary | ICD-10-CM | POA: Insufficient documentation

## 2018-10-29 DIAGNOSIS — R05 Cough: Secondary | ICD-10-CM | POA: Insufficient documentation

## 2018-10-29 NOTE — Assessment & Plan Note (Signed)
**Note De-identified Lehrmann Obfuscation** On thyroid replacement.  Follow tsh.  

## 2018-10-29 NOTE — Assessment & Plan Note (Signed)
**Note De-Identified Fehnel Obfuscation** S/p biopsy.  Abnormal.  See report.  Previously saw Dr Harlow Asa.  On synthroid.  Follow tsh.  Saw endocrinology.

## 2018-10-29 NOTE — Assessment & Plan Note (Signed)
**Note De-Identified Shidler Obfuscation** Physical today 10/27/18.  Schedule mammogram.

## 2018-10-29 NOTE — Assessment & Plan Note (Signed)
**Note De-Identified Bagnall Obfuscation** Some cough with associated changes with eating - acid reflux.  Treat with pepcid.  Follow.  Call with update over the next few weeks.

## 2018-10-29 NOTE — Assessment & Plan Note (Signed)
**Note De-Identified Aeschliman Obfuscation** Continue b12 supplementation.  Follow.

## 2018-11-11 ENCOUNTER — Other Ambulatory Visit: Payer: Self-pay | Admitting: Internal Medicine

## 2018-11-27 ENCOUNTER — Encounter: Payer: Self-pay | Admitting: Internal Medicine

## 2018-11-29 DIAGNOSIS — J029 Acute pharyngitis, unspecified: Secondary | ICD-10-CM | POA: Diagnosis not present

## 2018-11-29 DIAGNOSIS — J020 Streptococcal pharyngitis: Secondary | ICD-10-CM | POA: Diagnosis not present

## 2018-11-29 DIAGNOSIS — R6889 Other general symptoms and signs: Secondary | ICD-10-CM | POA: Diagnosis not present

## 2018-12-10 ENCOUNTER — Other Ambulatory Visit: Payer: Self-pay | Admitting: Internal Medicine

## 2018-12-25 ENCOUNTER — Ambulatory Visit: Payer: BLUE CROSS/BLUE SHIELD | Admitting: Internal Medicine

## 2019-01-15 ENCOUNTER — Encounter: Payer: Self-pay | Admitting: Internal Medicine

## 2019-01-15 NOTE — Telephone Encounter (Signed)
**Note De-Identified Addair Obfuscation** Called pt to get more information. Patient denied any fever, nausea, vomiting, body aches or chills. She does have a cough and a sore throat. Patient says she started coughing last Thursday and it is making her throat sore. She has not traveled or been around anyone who has traveled. Is there something we can send in or should I suggest urgent care?

## 2019-01-18 NOTE — Telephone Encounter (Signed)
**Note De-Identified Konigsberg Obfuscation** With her persistent symptoms, I can do a virtual visit if she agrees.

## 2019-01-18 NOTE — Telephone Encounter (Signed)
**Note De-identified Fiorello Obfuscation** LMTCB

## 2019-01-22 NOTE — Telephone Encounter (Signed)
**Note De-Identified Mcadam Obfuscation** Pt doing virtual visit tomorrow

## 2019-01-23 ENCOUNTER — Ambulatory Visit (INDEPENDENT_AMBULATORY_CARE_PROVIDER_SITE_OTHER): Payer: BLUE CROSS/BLUE SHIELD | Admitting: Internal Medicine

## 2019-01-23 DIAGNOSIS — R05 Cough: Secondary | ICD-10-CM

## 2019-01-23 DIAGNOSIS — Z9109 Other allergy status, other than to drugs and biological substances: Secondary | ICD-10-CM

## 2019-01-23 DIAGNOSIS — R059 Cough, unspecified: Secondary | ICD-10-CM

## 2019-01-23 DIAGNOSIS — E2839 Other primary ovarian failure: Secondary | ICD-10-CM

## 2019-01-23 NOTE — Progress Notes (Addendum)
**Note De-Identified Costley Obfuscation** Patient ID: Sandy Jefferson, female   DOB: Mar 10, 1981, 38 y.o.   MRN: [id number] Virtual Visit [place] Video Note  I connected with Eldoris Hathorne on 01/23/19 at  8:30 AM EDT by a video enabled telemedicine application and verified that I am speaking with the correct person using two identifiers.  Location patient: home Location provider:work  Persons participating in the virtual visit: patient, provider  I discussed the limitations of evaluation and management by telemedicine.  This visit type was conducted due to national recommendations for restrictions regarding the COVID-19 pandemic.  This format is felt to be most appropriate for this patient at this time.   The patient expressed understanding and agreed to proceed.   HPI: This was scheduled as a work in appt.  Was seen 10/27/18 with cough, copper taste, fatigue and concern over acid reflux.  She tried pepcid for a while.  States symptoms improved, but still notices the metal taste.  Recently has noticed increased drainage.  Feels like something in her throat.  Some cough.  No sinus pressure.  No chest congestion.  No sob.  Denies noticing anyacid reflux.  Some sore throat from cough.  No vomiting.  No chest pain or abdominal pain.  No fever.     ROS: See pertinent positives and negatives per HPI.  Past Medical History:  Diagnosis Date  . Bladder troubles   . Migraine headache     Past Surgical History:  Procedure Laterality Date  . CESAREAN SECTION    . HERNIA REPAIR    . HYSTEROSCOPY      Family History  Problem Relation Age of Onset  . Heart disease Brother   . Hyperlipidemia Mother   . Hyperlipidemia Father   . Cancer Maternal Aunt        breast  . Stroke Maternal Grandmother   . Hyperlipidemia Maternal Grandfather   . Cancer Paternal Grandmother        lung  . Diabetes Paternal Grandmother   . Diabetes Paternal Grandfather   . Colon cancer Other        great grandmother    SOCIAL HX: reviewed.    Current Outpatient  Medications:  .  cyanocobalamin (,VITAMIN B-12,) 1000 MCG/ML injection, Inject 1 mL (1,000 mcg total) into the muscle every 30 (thirty) days. Use needles and syringes for administration., Disp: 10 mL, Rfl: 1 .  estradiol (ESTRACE) 1 MG tablet, Take 1 tablet (1 mg total) by mouth daily., Disp: 90 tablet, Rfl: 1 .  levothyroxine (SYNTHROID, LEVOTHROID) 50 MCG tablet, TAKE 1 TABLET DAILY IN THE MORNING ON AN EMPTY STOMACH (OFFICE VISIT NEEDED FOR FURTHER REFILLS), Disp: 90 tablet, Rfl: 4 .  pantoprazole (PROTONIX) 40 MG tablet, Take 1 tablet (40 mg total) by mouth daily., Disp: 30 tablet, Rfl: 1 .  SYRINGE-NEEDLE, DISP, 3 ML (B-D 3CC LUER-LOK SYR 25GX1") 25G X 1" 3 ML MISC, USE 1 SYRINGE / NEEDLE SET FOR EACH B12 ADMINISTRATION ONCE A MONTH, Disp: 50 each, Rfl: 4  EXAM:  GENERAL: alert, oriented, appears well and in no acute distress  HEENT: atraumatic, conjunttiva clear, no obvious abnormalities on inspection of external nose.   NECK: normal movements of the head and neck  LUNGS: on inspection no signs of respiratory distress, breathing rate appears normal, no obvious gross SOB, gasping or wheezing  CV: no obvious cyanosis  PSYCH/NEURO: pleasant and cooperative, no obvious depression or anxiety, speech and thought processing grossly intact  ASSESSMENT AND PLAN:  Discussed the following assessment and **Note De-Identified Picotte Obfuscation** plan:  Cough  Estrogen deficiency  Environmental allergies     I discussed the assessment and treatment plan with the patient. The patient was provided an opportunity to ask questions and all were answered. The patient agreed with the plan and demonstrated an understanding of the instructions.   The patient was advised to call back or seek an in-person evaluation if the symptoms worsen or if the condition fails to improve as anticipated.  I provided 20 minutes of non-face-to-face time during this encounter.   Einar Pheasant, MD

## 2019-01-25 MED ORDER — PANTOPRAZOLE SODIUM 40 MG PO TBEC: 40.0000 mg | DELAYED_RELEASE_TABLET | Freq: Every day | ORAL | 1 refills | Status: DC

## 2019-01-27 ENCOUNTER — Encounter: Payer: Self-pay | Admitting: Internal Medicine

## 2019-01-27 DIAGNOSIS — Z9109 Other allergy status, other than to drugs and biological substances: Secondary | ICD-10-CM | POA: Insufficient documentation

## 2019-01-27 MED ORDER — ESTRADIOL 1 MG PO TABS: 1.0000 mg | ORAL_TABLET | Freq: Every day | ORAL | 1 refills | Status: DC

## 2019-01-27 NOTE — Assessment & Plan Note (Signed)
**Note De-Identified Neu Obfuscation** Discussed with her today.  Add nasacort nasal spray as directed.  Treat acid reflux.  Follow.

## 2019-01-27 NOTE — Assessment & Plan Note (Signed)
**Note De-Identified Barkey Obfuscation** Cough as outlined.  Feels originates in her throat.  No chest congestion.  No fever.  Using astelin.  Some drainage.  Treat allergies with nasacort nasal spray as directed.  Also start protonix.  Call with update over the next week.

## 2019-01-27 NOTE — Assessment & Plan Note (Signed)
**Note De-Identified Liburd Obfuscation** S/p TAH/BSO.  Was started on estrogen by gyn.  Request refill to mail order.

## 2019-02-05 ENCOUNTER — Other Ambulatory Visit: Payer: Self-pay

## 2019-02-05 MED ORDER — PANTOPRAZOLE SODIUM 40 MG PO TBEC: 40.0000 mg | DELAYED_RELEASE_TABLET | Freq: Every day | ORAL | 1 refills | Status: DC

## 2019-04-22 ENCOUNTER — Encounter: Payer: Self-pay | Admitting: Internal Medicine

## 2019-06-13 DIAGNOSIS — Z03818 Encounter for observation for suspected exposure to other biological agents ruled out: Secondary | ICD-10-CM | POA: Diagnosis not present

## 2019-07-17 ENCOUNTER — Other Ambulatory Visit: Payer: Self-pay | Admitting: Internal Medicine

## 2019-07-26 ENCOUNTER — Other Ambulatory Visit: Payer: Self-pay | Admitting: Internal Medicine

## 2019-10-23 ENCOUNTER — Telehealth: Payer: Self-pay | Admitting: Internal Medicine

## 2019-10-23 DIAGNOSIS — E038 Other specified hypothyroidism: Secondary | ICD-10-CM

## 2019-10-23 DIAGNOSIS — E559 Vitamin D deficiency, unspecified: Secondary | ICD-10-CM

## 2019-10-23 DIAGNOSIS — E538 Deficiency of other specified B group vitamins: Secondary | ICD-10-CM

## 2019-10-23 NOTE — Telephone Encounter (Signed)
**Note De-Identified Dorow Obfuscation** Pt wanted to have labs done before her appt and she also has a question about a dexa scan

## 2019-10-24 NOTE — Telephone Encounter (Signed)
**Note De-Identified Aldridge Obfuscation** I have pended the orders I though PCP might want?

## 2019-10-25 NOTE — Telephone Encounter (Signed)
**Note De-identified Tindall Obfuscation** Called and scheduled pt

## 2019-10-25 NOTE — Telephone Encounter (Signed)
**Note De-Identified Linares Obfuscation** Orders signed for labs.  Please schedule lab appt prior to her appt.  Thanks.

## 2019-11-09 ENCOUNTER — Other Ambulatory Visit: Payer: Self-pay | Admitting: Internal Medicine

## 2019-11-16 ENCOUNTER — Other Ambulatory Visit (INDEPENDENT_AMBULATORY_CARE_PROVIDER_SITE_OTHER): Payer: BC Managed Care – PPO

## 2019-11-16 ENCOUNTER — Other Ambulatory Visit: Payer: Self-pay

## 2019-11-16 DIAGNOSIS — E038 Other specified hypothyroidism: Secondary | ICD-10-CM

## 2019-11-16 DIAGNOSIS — E559 Vitamin D deficiency, unspecified: Secondary | ICD-10-CM | POA: Diagnosis not present

## 2019-11-16 DIAGNOSIS — E538 Deficiency of other specified B group vitamins: Secondary | ICD-10-CM

## 2019-11-16 LAB — CBC WITH DIFFERENTIAL/PLATELET
Basophils Absolute: 0.1 10*3/uL (ref 0.0–0.1)
Basophils Relative: 0.9 % (ref 0.0–3.0)
Eosinophils Absolute: 0.2 10*3/uL (ref 0.0–0.7)
Eosinophils Relative: 3.1 % (ref 0.0–5.0)
HCT: 37.9 % (ref 36.0–46.0)
Hemoglobin: 12.9 g/dL (ref 12.0–15.0)
Lymphocytes Relative: 35.9 % (ref 12.0–46.0)
Lymphs Abs: 2.1 10*3/uL (ref 0.7–4.0)
MCHC: 33.9 g/dL (ref 30.0–36.0)
MCV: 90.9 fl (ref 78.0–100.0)
Monocytes Absolute: 0.4 10*3/uL (ref 0.1–1.0)
Monocytes Relative: 6.6 % (ref 3.0–12.0)
Neutro Abs: 3.2 10*3/uL (ref 1.4–7.7)
Neutrophils Relative %: 53.5 % (ref 43.0–77.0)
Platelets: 209 10*3/uL (ref 150.0–400.0)
RBC: 4.17 Mil/uL (ref 3.87–5.11)
RDW: 12.5 % (ref 11.5–15.5)
WBC: 5.9 10*3/uL (ref 4.0–10.5)

## 2019-11-16 LAB — HEPATIC FUNCTION PANEL
ALT: 14 U/L (ref 0–35)
AST: 12 U/L (ref 0–37)
Albumin: 4.2 g/dL (ref 3.5–5.2)
Alkaline Phosphatase: 39 U/L (ref 39–117)
Bilirubin, Direct: 0.1 mg/dL (ref 0.0–0.3)
Total Bilirubin: 0.4 mg/dL (ref 0.2–1.2)
Total Protein: 7.1 g/dL (ref 6.0–8.3)

## 2019-11-16 LAB — BASIC METABOLIC PANEL
BUN: 15 mg/dL (ref 6–23)
CO2: 27 mEq/L (ref 19–32)
Calcium: 9.1 mg/dL (ref 8.4–10.5)
Chloride: 105 mEq/L (ref 96–112)
Creatinine, Ser: 0.83 mg/dL (ref 0.40–1.20)
GFR: 75.94 mL/min (ref >60.00)
Glucose, Bld: 98 mg/dL (ref 70–99)
Potassium: 4 mEq/L (ref 3.5–5.1)
Sodium: 141 mEq/L (ref 135–145)

## 2019-11-16 LAB — VITAMIN D 25 HYDROXY (VIT D DEFICIENCY, FRACTURES): VITD: 26.77 ng/mL — ABNORMAL LOW (ref 30.00–100.00)

## 2019-11-16 LAB — VITAMIN B12: Vitamin B-12: 590 pg/mL (ref 211–911)

## 2019-11-16 LAB — LIPID PANEL
Cholesterol: 201 mg/dL — ABNORMAL HIGH (ref 0–200)
HDL: 49.8 mg/dL (ref >39.00)
LDL Cholesterol: 114 mg/dL — ABNORMAL HIGH (ref 0–99)
NonHDL: 151.12
Total CHOL/HDL Ratio: 4
Triglycerides: 184 mg/dL — ABNORMAL HIGH (ref 0.0–149.0)
VLDL: 36.8 mg/dL (ref 0.0–40.0)

## 2019-11-16 LAB — TSH: TSH: 2.96 u[IU]/mL (ref 0.35–4.50)

## 2019-11-19 ENCOUNTER — Ambulatory Visit: Payer: Self-pay | Admitting: Internal Medicine

## 2019-11-20 ENCOUNTER — Ambulatory Visit (INDEPENDENT_AMBULATORY_CARE_PROVIDER_SITE_OTHER): Payer: BC Managed Care – PPO | Admitting: Internal Medicine

## 2019-11-20 ENCOUNTER — Encounter: Payer: Self-pay | Admitting: Internal Medicine

## 2019-11-20 ENCOUNTER — Other Ambulatory Visit: Payer: Self-pay

## 2019-11-20 VITALS — Ht 67.0 in | Wt 155.0 lb

## 2019-11-20 DIAGNOSIS — E038 Other specified hypothyroidism: Secondary | ICD-10-CM

## 2019-11-20 DIAGNOSIS — M545 Low back pain, unspecified: Secondary | ICD-10-CM

## 2019-11-20 DIAGNOSIS — Z1231 Encounter for screening mammogram for malignant neoplasm of breast: Secondary | ICD-10-CM

## 2019-11-20 DIAGNOSIS — Z9109 Other allergy status, other than to drugs and biological substances: Secondary | ICD-10-CM | POA: Diagnosis not present

## 2019-11-20 DIAGNOSIS — E781 Pure hyperglyceridemia: Secondary | ICD-10-CM

## 2019-11-20 DIAGNOSIS — E2839 Other primary ovarian failure: Secondary | ICD-10-CM

## 2019-11-20 DIAGNOSIS — E041 Nontoxic single thyroid nodule: Secondary | ICD-10-CM

## 2019-11-20 NOTE — Progress Notes (Signed)
**Note De-Identified Geffre Obfuscation** Patient ID: Sandy Jefferson, female   DOB: 21-Jan-1981, 39 y.o.   MRN: [id number]   Virtual Visit Stitt video Note  This visit type was conducted due to national recommendations for restrictions regarding the COVID-19 pandemic (e.g. social distancing).  This format is felt to be most appropriate for this patient at this time.  All issues noted in this document were discussed and addressed.  No physical exam was performed (except for noted visual exam findings with Video Visits).   I connected with Sandy Jefferson by a video enabled telemedicine application and verified that I am speaking with the correct person using two identifiers. Location patient: home Location provider: work Persons participating in the virtual visit: patient, provider  I discussed the limitations, risks, security and privacy concerns of performing an evaluation and management service by video and the availability of in person appointments. The patient expressed understanding and agreed to proceed.  Reason for visit: scheduled follow up.    HPI: She reports she is doing relatively well.  Handling stress. Discussed labs.  Increased triglycerides.  Send [facility] Lipid diet.  Stays active.  No chest pain or sob reported.  Bowels stable.  Has noticed "every bone hurting". Some low back aching at times.  Did not report any pain radiating down legs.  In winter, notices burning in her mouth.  Does not notice at other times.  History of thyroid nodule.  Overdue f/u with endocrinology.  Discussed mammogram.      ROS: See pertinent positives and negatives per HPI.  Past Medical History:  Diagnosis Date  . Bladder troubles   . Migraine headache     Past Surgical History:  Procedure Laterality Date  . CESAREAN SECTION    . HERNIA REPAIR    . HYSTEROSCOPY      Family History  Problem Relation Age of Onset  . Heart disease Brother   . Hyperlipidemia Mother   . Hyperlipidemia Father   . Cancer Maternal Aunt        breast  . Stroke  Maternal Grandmother   . Hyperlipidemia Maternal Grandfather   . Cancer Paternal Grandmother        lung  . Diabetes Paternal Grandmother   . Diabetes Paternal Grandfather   . Colon cancer Other        great grandmother    SOCIAL HX: reviewed.    Current Outpatient Medications:  .  cyanocobalamin (,VITAMIN B-12,) 1000 MCG/ML injection, INJECT 1 ML (1000 MCG) INTO THE MUSCLE EVERY 30 DAYS, USE NEEDLES AND SYRINGES FOR ADMINISTRATION, Disp: 10 mL, Rfl: 3 .  estradiol (ESTRACE) 1 MG tablet, TAKE 1 TABLET DAILY, Disp: 90 tablet, Rfl: 3 .  levothyroxine (SYNTHROID) 50 MCG tablet, Take 1 tablet (50 mcg total) by mouth daily before breakfast., Disp: 90 tablet, Rfl: 1 .  pantoprazole (PROTONIX) 40 MG tablet, TAKE 1 TABLET DAILY, Disp: 90 tablet, Rfl: 3 .  SYRINGE-NEEDLE, DISP, 3 ML (B-D 3CC LUER-LOK SYR 25GX1") 25G X 1" 3 ML MISC, USE 1 SYRINGE / NEEDLE SET FOR EACH B12 ADMINISTRATION ONCE A MONTH, Disp: 50 each, Rfl: 4  EXAM:  GENERAL: alert, oriented, appears well and in no acute distress  HEENT: atraumatic, conjunttiva clear, no obvious abnormalities on inspection of external nose and ears  NECK: normal movements of the head and neck  LUNGS: on inspection no signs of respiratory distress, breathing rate appears normal, no obvious gross SOB, gasping or wheezing  CV: no obvious cyanosis  PSYCH/NEURO: pleasant and cooperative, no **Note De-Identified Cutshaw Obfuscation** obvious depression or anxiety, speech and thought processing grossly intact  ASSESSMENT AND PLAN:  Discussed the following assessment and plan:  Back pain Stretches.  Consider physical therapy if desires.   Environmental allergies Stable.    Estrogen deficiency S/p TAH/BSO.  Started on estrogen by gyn.  Continues on estrogen.   Hypothyroidism On thyroid replacement.  Follow tsh.   Right thyroid nodule S/p biopsy.  Abnormal.  On synthroid.  Saw endocrinology.  Schedule f/u with Dr Gabriel Carina.   Hypertriglyceridemia Low carb diet.  [facility] lipid diet.   Follow lipid panel.    Orders Placed This Encounter  Procedures  . MM DIAG BREAST TOMO BILATERAL    Standing Status:   Future    Standing Expiration Date:   01/21/2021    Order Specific Question:   Reason for Exam (SYMPTOM  OR DIAGNOSIS REQUIRED)    Answer:   breast cancer screening    Order Specific Question:   Is the patient pregnant?    Answer:   No    Order Specific Question:   Preferred imaging location?    Answer:   [facility]  . Ambulatory referral to Endocrinology    Referral Priority:   Routine    Referral Type:   Consultation    Referral Reason:   Specialty Services Required    Number of Visits Requested:   1    Meds ordered this encounter  Medications  . levothyroxine (SYNTHROID) 50 MCG tablet    Sig: Take 1 tablet (50 mcg total) by mouth daily before breakfast.    Dispense:  90 tablet    Refill:  1     I discussed the assessment and treatment plan with the patient. The patient was provided an opportunity to ask questions and all were answered. The patient agreed with the plan and demonstrated an understanding of the instructions.   The patient was advised to call back or seek an in-person evaluation if the symptoms worsen or if the condition fails to improve as anticipated.   Einar Pheasant, MD

## 2019-11-24 ENCOUNTER — Telehealth: Payer: Self-pay | Admitting: Internal Medicine

## 2019-11-24 DIAGNOSIS — Z1231 Encounter for screening mammogram for malignant neoplasm of breast: Secondary | ICD-10-CM

## 2019-11-24 DIAGNOSIS — E781 Pure hyperglyceridemia: Secondary | ICD-10-CM | POA: Insufficient documentation

## 2019-11-24 MED ORDER — LEVOTHYROXINE SODIUM 50 MCG PO TABS: 50.0000 ug | ORAL_TABLET | Freq: Every day | ORAL | 1 refills | Status: DC

## 2019-11-24 NOTE — Telephone Encounter (Signed)
**Note De-Identified Paulick Obfuscation** Please schedule her mammogram.  Prefers after 3:00 - any day.  Also, send [facility] Lipid diet.

## 2019-11-24 NOTE — Assessment & Plan Note (Signed)
**Note De-Identified Boller Obfuscation** S/p biopsy.  Abnormal.  On synthroid.  Saw endocrinology.  Schedule f/u with Dr Gabriel Carina.

## 2019-11-24 NOTE — Assessment & Plan Note (Signed)
**Note De-identified Whack Obfuscation** On thyroid replacement.  Follow tsh.  

## 2019-11-24 NOTE — Assessment & Plan Note (Signed)
**Note De-Identified Ronda Obfuscation** Low carb diet.  [facility] lipid diet.  Follow lipid panel.

## 2019-11-24 NOTE — Assessment & Plan Note (Signed)
**Note De-Identified Cossey Obfuscation** S/p TAH/BSO.  Started on estrogen by gyn.  Continues on estrogen.

## 2019-11-24 NOTE — Assessment & Plan Note (Signed)
**Note De-identified Haggard Obfuscation** Stable

## 2019-11-24 NOTE — Assessment & Plan Note (Signed)
**Note De-Identified Jarosz Obfuscation** Stretches.  Consider physical therapy if desires.

## 2019-11-29 NOTE — Addendum Note (Signed)
**Note De-Identified Pletz Obfuscation** Addended by: Alisa Graff on: 11/29/2019 01:22 PM   Modules accepted: Orders

## 2019-11-29 NOTE — Telephone Encounter (Signed)
**Note De-identified Quintero Obfuscation** Order placed for screening mammogram

## 2019-11-29 NOTE — Telephone Encounter (Signed)
**Note De-Identified Jergens Obfuscation** Called to schedule mammogram. Order is in for diagnostic. Called pt to confirm no issues. She has never had a mammogram before so this would just be her baseline. Just wanted to double check with you about the order before changing it to a regular screening.

## 2019-12-04 NOTE — Telephone Encounter (Signed)
**Note De-Identified Marrufo Obfuscation** Mammogram scheduled. [facility] lipid diet mailed.

## 2019-12-18 ENCOUNTER — Other Ambulatory Visit: Payer: Self-pay | Admitting: Internal Medicine

## 2020-01-10 ENCOUNTER — Ambulatory Visit
Admission: RE | Admit: 2020-01-10 | Discharge: 2020-01-10 | Disposition: A | Discharge status: Home / Self Care | Payer: BC Managed Care – PPO | Source: Ambulatory Visit | Attending: Internal Medicine | Admitting: Internal Medicine

## 2020-01-10 DIAGNOSIS — Z1231 Encounter for screening mammogram for malignant neoplasm of breast: Secondary | ICD-10-CM

## 2020-05-22 ENCOUNTER — Other Ambulatory Visit: Payer: Self-pay | Admitting: Internal Medicine

## 2020-05-27 ENCOUNTER — Encounter: Payer: BC Managed Care – PPO | Admitting: Internal Medicine

## 2020-06-22 ENCOUNTER — Other Ambulatory Visit: Payer: Self-pay | Admitting: Internal Medicine

## 2020-07-07 ENCOUNTER — Encounter: Payer: Self-pay | Admitting: Internal Medicine

## 2020-08-08 ENCOUNTER — Encounter: Payer: Self-pay | Admitting: Internal Medicine

## 2020-08-11 ENCOUNTER — Encounter: Payer: BC Managed Care – PPO | Admitting: Internal Medicine

## 2020-09-09 ENCOUNTER — Other Ambulatory Visit: Payer: Self-pay

## 2020-09-09 ENCOUNTER — Ambulatory Visit (INDEPENDENT_AMBULATORY_CARE_PROVIDER_SITE_OTHER): Payer: BC Managed Care – PPO | Admitting: Internal Medicine

## 2020-09-09 ENCOUNTER — Encounter: Payer: Self-pay | Admitting: Internal Medicine

## 2020-09-09 VITALS — BP 108/60 | HR 80 | Temp 98.1°F | Resp 16 | Ht 67.0 in | Wt 154.2 lb

## 2020-09-09 DIAGNOSIS — E538 Deficiency of other specified B group vitamins: Secondary | ICD-10-CM | POA: Diagnosis not present

## 2020-09-09 DIAGNOSIS — Z0000 Encounter for general adult medical examination without abnormal findings: Secondary | ICD-10-CM

## 2020-09-09 DIAGNOSIS — E781 Pure hyperglyceridemia: Secondary | ICD-10-CM

## 2020-09-09 DIAGNOSIS — E041 Nontoxic single thyroid nodule: Secondary | ICD-10-CM

## 2020-09-09 DIAGNOSIS — E038 Other specified hypothyroidism: Secondary | ICD-10-CM

## 2020-09-09 DIAGNOSIS — E2839 Other primary ovarian failure: Secondary | ICD-10-CM

## 2020-09-09 DIAGNOSIS — R194 Change in bowel habit: Secondary | ICD-10-CM

## 2020-09-09 DIAGNOSIS — E559 Vitamin D deficiency, unspecified: Secondary | ICD-10-CM

## 2020-09-09 DIAGNOSIS — Z9109 Other allergy status, other than to drugs and biological substances: Secondary | ICD-10-CM

## 2020-09-09 DIAGNOSIS — R200 Anesthesia of skin: Secondary | ICD-10-CM | POA: Insufficient documentation

## 2020-09-09 DIAGNOSIS — Z0001 Encounter for general adult medical examination with abnormal findings: Secondary | ICD-10-CM

## 2020-09-09 NOTE — Progress Notes (Signed)
**Note De-Identified Gartman Obfuscation** Patient ID: Sandy Jefferson, female   DOB: May 19, 1981, 39 y.o.   MRN: [id number]   Subjective:    Patient ID: Sandy Jefferson, female    DOB: 02-Feb-1981, 39 y.o.   MRN: [id number]  HPI This visit occurred during the SARS-CoV-2 public health emergency.  Safety protocols were in place, including screening questions prior to the visit, additional usage of staff PPE, and extensive cleaning of exam room while observing appropriate contact time as indicated for disinfecting solutions.  Patient here for her physical exam.  She she has noticed recently numbness in her right arm.  Also reports numbness in her legs as well.  No weakness.  No increased headache.  No chest pain or sob reported.  Eating.  No nausea or vomiting reported. Some constipation.  Taking magnesium.  Has seen endocrinology previously for f/u thyroid nodule - pathology - cytologic atypia.  Had f/u with endocrinology.  Ultrasound - nodule smaller.  Discussed f/u today.  She is agreeable to referral back to endocrinology.     Past Medical History:  Diagnosis Date  . Bladder troubles   . Migraine headache    Past Surgical History:  Procedure Laterality Date  . CESAREAN SECTION    . HERNIA REPAIR    . HYSTEROSCOPY     Family History  Problem Relation Age of Onset  . Heart disease Brother   . Hyperlipidemia Mother   . Hyperlipidemia Father   . Cancer Maternal Aunt        breast  . Breast cancer Maternal Aunt        late 5's  . Stroke Maternal Grandmother   . Breast cancer Maternal Grandmother        great  . Ovarian cancer Maternal Grandmother   . Colon cancer Maternal Grandmother   . Hyperlipidemia Maternal Grandfather   . Cancer Paternal Grandmother        lung  . Diabetes Paternal Grandmother   . Diabetes Paternal Grandfather   . Colon cancer Other        great grandmother   Social History   Socioeconomic History  . Marital status: Married    Spouse name: Not on file  . Number of children: 4  . Years of education:  Not on file  . Highest education level: Not on file  Occupational History  . Not on file  Tobacco Use  . Smoking status: Never Smoker  . Smokeless tobacco: Never Used  Substance and Sexual Activity  . Alcohol use: Yes    Alcohol/week: 0.0 standard drinks    Comment: rarely  . Drug use: No  . Sexual activity: Not on file  Other Topics Concern  . Not on file  Social History Narrative  . Not on file   Social Determinants of Health   Financial Resource Strain:   . Difficulty of Paying Living Expenses: Not on file  Food Insecurity:   . Worried About Charity fundraiser in the Last Year: Not on file  . Ran Out of Food in the Last Year: Not on file  Transportation Needs:   . Lack of Transportation (Medical): Not on file  . Lack of Transportation (Non-Medical): Not on file  Physical Activity:   . Days of Exercise per Week: Not on file  . Minutes of Exercise per Session: Not on file  Stress:   . Feeling of Stress : Not on file  Social Connections:   . Frequency of Communication with Friends and Family: Not on **Note De-Identified Lowder Obfuscation** file  . Frequency of Social Gatherings with Friends and Family: Not on file  . Attends Religious Services: Not on file  . Active Member of Clubs or Organizations: Not on file  . Attends Archivist Meetings: Not on file  . Marital Status: Not on file   Outpatient Encounter Medications as of 09/09/2020  Medication Sig  . cyanocobalamin (,VITAMIN B-12,) 1000 MCG/ML injection INJECT 1 ML (1000 MCG) INTO THE MUSCLE EVERY 30 DAYS, USE NEEDLES AND SYRINGES FOR ADMINISTRATION  . estradiol (ESTRACE) 1 MG tablet TAKE 1 TABLET DAILY  . levothyroxine (SYNTHROID) 50 MCG tablet TAKE 1 TABLET DAILY BEFORE BREAKFAST  . pantoprazole (PROTONIX) 40 MG tablet TAKE 1 TABLET DAILY  . SYRINGE-NEEDLE, DISP, 3 ML (B-D 3CC LUER-LOK SYR 25GX1") 25G X 1" 3 ML MISC USE 1 SYRINGE / NEEDLE SET FOR EACH B12 ADMINISTRATION ONCE A MONTH   No facility-administered encounter medications on file as  of 09/09/2020.    Review of Systems  Constitutional: Negative for appetite change and unexpected weight change.  HENT: Negative for congestion, sinus pressure and sore throat.   Eyes: Negative for pain and visual disturbance.  Respiratory: Negative for cough, chest tightness and shortness of breath.   Cardiovascular: Negative for chest pain, palpitations and leg swelling.  Gastrointestinal: Positive for constipation. Negative for abdominal pain, diarrhea, nausea and vomiting.  Genitourinary: Negative for difficulty urinating and dysuria.  Musculoskeletal: Negative for joint swelling and myalgias.  Skin: Negative for color change and rash.  Neurological: Negative for dizziness, light-headedness and headaches.  Hematological: Negative for adenopathy. Does not bruise/bleed easily.  Psychiatric/Behavioral: Negative for agitation and decreased concentration.       Objective:    Physical Exam Vitals reviewed.  Constitutional:      General: She is not in acute distress.    Appearance: Normal appearance. She is well-developed.  HENT:     Head: Normocephalic and atraumatic.     Right Ear: External ear normal.     Left Ear: External ear normal.  Eyes:     General: No scleral icterus.       Right eye: No discharge.        Left eye: No discharge.     Conjunctiva/sclera: Conjunctivae normal.  Neck:     Thyroid: No thyromegaly.  Cardiovascular:     Rate and Rhythm: Normal rate and regular rhythm.  Pulmonary:     Effort: No tachypnea, accessory muscle usage or respiratory distress.     Breath sounds: Normal breath sounds. No decreased breath sounds or wheezing.  Chest:     Breasts:        Right: No inverted nipple, mass, nipple discharge or tenderness (no axillary adenopathy).        Left: No inverted nipple, mass, nipple discharge or tenderness (no axilarry adenopathy).  Abdominal:     General: Bowel sounds are normal.     Palpations: Abdomen is soft.     Tenderness: There is no  abdominal tenderness.  Musculoskeletal:        General: No swelling or tenderness.     Cervical back: Neck supple. No tenderness.  Lymphadenopathy:     Cervical: No cervical adenopathy.  Skin:    Findings: No erythema or rash.  Neurological:     Mental Status: She is alert and oriented to person, place, and time.  Psychiatric:        Mood and Affect: Mood normal.        Behavior: Behavior normal. **Note De-Identified Ledlow Obfuscation** BP 108/60   Pulse 80   Temp 98.1 F (36.7 C) (Oral)   Resp 16   Ht 5\' 7"  (1.702 m)   Wt 154 lb 3.2 oz (69.9 kg)   SpO2 98%   BMI 24.15 kg/m  Wt Readings from Last 3 Encounters:  09/09/20 154 lb 3.2 oz (69.9 kg)  11/20/19 155 lb (70.3 kg)  10/27/18 155 lb 3.2 oz (70.4 kg)     Lab Results  Component Value Date   WBC 6.9 09/09/2020   HGB 13.0 09/09/2020   HCT 37.6 09/09/2020   PLT 193.0 09/09/2020   GLUCOSE 84 09/09/2020   CHOL 201 (H) 11/16/2019   TRIG 184.0 (H) 11/16/2019   HDL 49.80 11/16/2019   LDLCALC 114 (H) 11/16/2019   ALT 13 09/09/2020   AST 13 09/09/2020   NA 138 09/09/2020   K 3.9 09/09/2020   CL 102 09/09/2020   CREATININE 0.82 09/09/2020   BUN 22 09/09/2020   CO2 29 09/09/2020   TSH 2.09 09/09/2020    MM 3D SCREEN BREAST BILATERAL  Result Date: 01/11/2020 CLINICAL DATA:  Screening. EXAM: DIGITAL SCREENING BILATERAL MAMMOGRAM WITH TOMO AND CAD COMPARISON:  None. ACR Breast Density Category d: The breast tissue is extremely dense, which lowers the sensitivity of mammography. FINDINGS: There are no findings suspicious for malignancy. Images were processed with CAD. IMPRESSION: No mammographic evidence of malignancy. A result letter of this screening mammogram will be mailed directly to the patient. RECOMMENDATION: Screening mammogram in one year. (Code:SM-B-01Y) BI-RADS CATEGORY  1: Negative. Electronically Signed   By: Ammie Ferrier M.D.   On: 01/11/2020 13:31       Assessment & Plan:   Problem List Items Addressed This Visit    Vitamin D  deficiency    Recheck vitamin  D level.       Relevant Orders   VITAMIN D 25 Hydroxy (Vit-D Deficiency, Fractures) (Completed)   Right thyroid nodule    S/p biopsy.  Abnormal. On synthroid.  Saw endocrinology.  Had f/u ultrasound.  Per report,nodule smaller.  Refer back to endocrinology for further evaluation and follow up given original biopsy findings.        Relevant Orders   Ambulatory referral to Endocrinology   Hypothyroidism    On thyroid replacement.  Follow tsh.       Hypertriglyceridemia    Low carb diet and exercise.  Follow lipid panel.       Relevant Orders   Hepatic function panel (Completed)   Basic metabolic panel (Completed)   Health care maintenance    Physical today 09/09/20.  Mammogram 01/11/20 - Birads I.       Extremity numbness    Reports persistent numbness lower extremities and right upper arm.  Refer to neurology for question of need for NCS.  Follow.  Check routine labs and B12.       Relevant Orders   CBC with Differential/Platelet (Completed)   TSH (Completed)   Vitamin B12 (Completed)   Ambulatory referral to Neurology   Estrogen deficiency    S/p TAH/BSO.  Started on estrogen by gyn.        Environmental allergies    Stable.       Change in bowel habits    With constipation. Discussed - taking magnesium.  Miralax, etc.  Follow.       B12 deficiency    Check B12 level with labs today.         Other Visit Diagnoses **Note De-Identified Tonkovich Obfuscation** Encounter for general adult medical examination with abnormal findings    -  Primary       Einar Pheasant, MD

## 2020-09-09 NOTE — Assessment & Plan Note (Signed)
**Note De-Identified Dann Obfuscation** Physical today 09/09/20.  Mammogram 01/11/20 - Birads I.

## 2020-09-10 LAB — CBC WITH DIFFERENTIAL/PLATELET
Basophils Absolute: 0.1 10*3/uL (ref 0.0–0.1)
Basophils Relative: 1.5 % (ref 0.0–3.0)
Eosinophils Absolute: 0.2 10*3/uL (ref 0.0–0.7)
Eosinophils Relative: 3.1 % (ref 0.0–5.0)
HCT: 37.6 % (ref 36.0–46.0)
Hemoglobin: 13 g/dL (ref 12.0–15.0)
Lymphocytes Relative: 29.2 % (ref 12.0–46.0)
Lymphs Abs: 2 10*3/uL (ref 0.7–4.0)
MCHC: 34.4 g/dL (ref 30.0–36.0)
MCV: 90.6 fl (ref 78.0–100.0)
Monocytes Absolute: 0.4 10*3/uL (ref 0.1–1.0)
Monocytes Relative: 6.1 % (ref 3.0–12.0)
Neutro Abs: 4.1 10*3/uL (ref 1.4–7.7)
Neutrophils Relative %: 60.1 % (ref 43.0–77.0)
Platelets: 193 10*3/uL (ref 150.0–400.0)
RBC: 4.15 Mil/uL (ref 3.87–5.11)
RDW: 12.5 % (ref 11.5–15.5)
WBC: 6.9 10*3/uL (ref 4.0–10.5)

## 2020-09-10 LAB — HEPATIC FUNCTION PANEL
ALT: 13 U/L (ref 0–35)
AST: 13 U/L (ref 0–37)
Albumin: 4.3 g/dL (ref 3.5–5.2)
Alkaline Phosphatase: 35 U/L — ABNORMAL LOW (ref 39–117)
Bilirubin, Direct: 0.1 mg/dL (ref 0.0–0.3)
Total Bilirubin: 0.7 mg/dL (ref 0.2–1.2)
Total Protein: 6.8 g/dL (ref 6.0–8.3)

## 2020-09-10 LAB — BASIC METABOLIC PANEL
BUN: 22 mg/dL (ref 6–23)
CO2: 29 mEq/L (ref 19–32)
Calcium: 9.2 mg/dL (ref 8.4–10.5)
Chloride: 102 mEq/L (ref 96–112)
Creatinine, Ser: 0.82 mg/dL (ref 0.40–1.20)
GFR: 88.91 mL/min (ref >60.00)
Glucose, Bld: 84 mg/dL (ref 70–99)
Potassium: 3.9 mEq/L (ref 3.5–5.1)
Sodium: 138 mEq/L (ref 135–145)

## 2020-09-10 LAB — VITAMIN B12: Vitamin B-12: 725 pg/mL (ref 211–911)

## 2020-09-10 LAB — TSH: TSH: 2.09 u[IU]/mL (ref 0.35–4.50)

## 2020-09-10 LAB — VITAMIN D 25 HYDROXY (VIT D DEFICIENCY, FRACTURES): VITD: 46.49 ng/mL (ref 30.00–100.00)

## 2020-09-14 ENCOUNTER — Encounter: Payer: Self-pay | Admitting: Internal Medicine

## 2020-09-14 NOTE — Assessment & Plan Note (Signed)
**Note De-Identified Bixler Obfuscation** With constipation. Discussed - taking magnesium.  Miralax, etc.  Follow.

## 2020-09-14 NOTE — Assessment & Plan Note (Signed)
**Note De-Identified Orlowski Obfuscation** S/p biopsy.  Abnormal. On synthroid.  Saw endocrinology.  Had f/u ultrasound.  Per report,nodule smaller.  Refer back to endocrinology for further evaluation and follow up given original biopsy findings.

## 2020-09-14 NOTE — Assessment & Plan Note (Signed)
**Note De-identified Lafoy Obfuscation** On thyroid replacement.  Follow tsh.  

## 2020-09-14 NOTE — Assessment & Plan Note (Signed)
**Note De-identified Benedick Obfuscation** Stable

## 2020-09-14 NOTE — Assessment & Plan Note (Signed)
**Note De-identified Speigner Obfuscation** Low carb diet and exercise.  Follow lipid panel.  

## 2020-09-14 NOTE — Assessment & Plan Note (Signed)
**Note De-Identified Mancera Obfuscation** Check B12 level with labs today.

## 2020-09-14 NOTE — Assessment & Plan Note (Signed)
**Note De-Identified Kantner Obfuscation** Reports persistent numbness lower extremities and right upper arm.  Refer to neurology for question of need for NCS.  Follow.  Check routine labs and B12.

## 2020-09-14 NOTE — Assessment & Plan Note (Signed)
**Note De-identified Ricard Obfuscation** Recheck vitamin D level 

## 2020-09-14 NOTE — Assessment & Plan Note (Signed)
**Note De-Identified Favela Obfuscation** S/p TAH/BSO.  Started on estrogen by gyn.

## 2020-09-29 ENCOUNTER — Encounter: Payer: Self-pay | Admitting: Internal Medicine

## 2020-09-29 DIAGNOSIS — T7840XA Allergy, unspecified, initial encounter: Secondary | ICD-10-CM

## 2020-09-30 NOTE — Telephone Encounter (Signed)
**Note De-Identified Schwier Obfuscation** Order placed for allergy referral.

## 2020-11-06 DIAGNOSIS — Z114 Encounter for screening for human immunodeficiency virus [HIV]: Secondary | ICD-10-CM | POA: Diagnosis not present

## 2020-11-06 DIAGNOSIS — R200 Anesthesia of skin: Secondary | ICD-10-CM | POA: Diagnosis not present

## 2020-11-06 DIAGNOSIS — E531 Pyridoxine deficiency: Secondary | ICD-10-CM | POA: Diagnosis not present

## 2020-11-06 DIAGNOSIS — E519 Thiamine deficiency, unspecified: Secondary | ICD-10-CM | POA: Diagnosis not present

## 2020-11-06 DIAGNOSIS — Z131 Encounter for screening for diabetes mellitus: Secondary | ICD-10-CM | POA: Diagnosis not present

## 2020-11-06 DIAGNOSIS — R202 Paresthesia of skin: Secondary | ICD-10-CM | POA: Diagnosis not present

## 2020-11-06 DIAGNOSIS — E538 Deficiency of other specified B group vitamins: Secondary | ICD-10-CM | POA: Diagnosis not present

## 2020-11-11 DIAGNOSIS — R200 Anesthesia of skin: Secondary | ICD-10-CM | POA: Diagnosis not present

## 2020-11-11 DIAGNOSIS — R202 Paresthesia of skin: Secondary | ICD-10-CM | POA: Diagnosis not present

## 2020-11-19 DIAGNOSIS — R202 Paresthesia of skin: Secondary | ICD-10-CM | POA: Diagnosis not present

## 2020-11-19 DIAGNOSIS — R200 Anesthesia of skin: Secondary | ICD-10-CM | POA: Diagnosis not present

## 2020-11-21 DIAGNOSIS — T781XXA Other adverse food reactions, not elsewhere classified, initial encounter: Secondary | ICD-10-CM | POA: Diagnosis not present

## 2020-11-21 DIAGNOSIS — J3089 Other allergic rhinitis: Secondary | ICD-10-CM | POA: Diagnosis not present

## 2020-12-01 DIAGNOSIS — R200 Anesthesia of skin: Secondary | ICD-10-CM | POA: Diagnosis not present

## 2020-12-01 DIAGNOSIS — R202 Paresthesia of skin: Secondary | ICD-10-CM | POA: Diagnosis not present

## 2020-12-01 DIAGNOSIS — E063 Autoimmune thyroiditis: Secondary | ICD-10-CM | POA: Diagnosis not present

## 2020-12-11 ENCOUNTER — Ambulatory Visit: Payer: BC Managed Care – PPO | Admitting: Internal Medicine

## 2020-12-11 ENCOUNTER — Other Ambulatory Visit: Payer: Self-pay

## 2020-12-11 DIAGNOSIS — E781 Pure hyperglyceridemia: Secondary | ICD-10-CM | POA: Diagnosis not present

## 2020-12-11 DIAGNOSIS — R200 Anesthesia of skin: Secondary | ICD-10-CM

## 2020-12-11 DIAGNOSIS — E038 Other specified hypothyroidism: Secondary | ICD-10-CM

## 2020-12-11 DIAGNOSIS — E559 Vitamin D deficiency, unspecified: Secondary | ICD-10-CM | POA: Diagnosis not present

## 2020-12-11 DIAGNOSIS — E041 Nontoxic single thyroid nodule: Secondary | ICD-10-CM | POA: Diagnosis not present

## 2020-12-11 NOTE — Progress Notes (Signed)
**Note De-Identified Pawlicki Obfuscation** Subjective:    Patient ID: Sandy Jefferson, female    DOB: 02-Oct-1981, 40 y.o.   MRN: [id number]  HPI This visit occurred during the SARS-CoV-2 public health emergency.  Safety protocols were in place, including screening questions prior to the visit, additional usage of staff PPE, and extensive cleaning of exam room while observing appropriate contact time as indicated for disinfecting solutions.  Patient here for a scheduled follow up.  Here to follow up regarding her thyroid, cholesterol and extremity numbness.  Saw neurology recently.  Had NCS.  CTS present.  Lower extremities wnl.  Injections scheduled - for CTS.  She has adjusted her diet.  Stopped eating soy.  Feels better.  Weight down.  Miralax/tea - controls bowels.  No chest pain or sob reported.  No abdominal pain reported.  Saw endocrinology about her thyroid.     Past Medical History:  Diagnosis Date  . Bladder troubles   . Migraine headache    Past Surgical History:  Procedure Laterality Date  . CESAREAN SECTION    . HERNIA REPAIR    . HYSTEROSCOPY     Family History  Problem Relation Age of Onset  . Heart disease Brother   . Hyperlipidemia Mother   . Hyperlipidemia Father   . Cancer Maternal Aunt        breast  . Breast cancer Maternal Aunt        late 39's  . Stroke Maternal Grandmother   . Breast cancer Maternal Grandmother        great  . Ovarian cancer Maternal Grandmother   . Colon cancer Maternal Grandmother   . Hyperlipidemia Maternal Grandfather   . Cancer Paternal Grandmother        lung  . Diabetes Paternal Grandmother   . Diabetes Paternal Grandfather   . Colon cancer Other        great grandmother   Social History   Socioeconomic History  . Marital status: Married    Spouse name: Not on file  . Number of children: 4  . Years of education: Not on file  . Highest education level: Not on file  Occupational History  . Not on file  Tobacco Use  . Smoking status: Never Smoker  . Smokeless  tobacco: Never Used  Substance and Sexual Activity  . Alcohol use: Yes    Alcohol/week: 0.0 standard drinks    Comment: rarely  . Drug use: No  . Sexual activity: Not on file  Other Topics Concern  . Not on file  Social History Narrative  . Not on file   Social Determinants of Health   Financial Resource Strain: Not on file  Food Insecurity: Not on file  Transportation Needs: Not on file  Physical Activity: Not on file  Stress: Not on file  Social Connections: Not on file    Outpatient Encounter Medications as of 12/11/2020  Medication Sig  . cyanocobalamin (,VITAMIN B-12,) 1000 MCG/ML injection INJECT 1 ML (1000 MCG) INTO THE MUSCLE EVERY 30 DAYS, USE NEEDLES AND SYRINGES FOR ADMINISTRATION  . estradiol (ESTRACE) 1 MG tablet TAKE 1 TABLET DAILY  . levothyroxine (SYNTHROID) 50 MCG tablet TAKE 1 TABLET DAILY BEFORE BREAKFAST  . pantoprazole (PROTONIX) 40 MG tablet TAKE 1 TABLET DAILY  . SYRINGE-NEEDLE, DISP, 3 ML (B-D 3CC LUER-LOK SYR 25GX1") 25G X 1" 3 ML MISC USE 1 SYRINGE / NEEDLE SET FOR EACH B12 ADMINISTRATION ONCE A MONTH   No facility-administered encounter medications on file as of 12/11/2020. **Note De-Identified Heuerman Obfuscation** Review of Systems  Constitutional: Negative for appetite change.       Has adjusted her diet.  Lost weight.   HENT: Negative for congestion and sinus pressure.   Respiratory: Negative for cough, chest tightness and shortness of breath.   Cardiovascular: Negative for chest pain, palpitations and leg swelling.  Gastrointestinal: Negative for abdominal pain, diarrhea, nausea and vomiting.  Genitourinary: Negative for difficulty urinating and dysuria.  Musculoskeletal: Negative for joint swelling and myalgias.  Skin: Negative for color change and rash.  Neurological: Negative for dizziness, light-headedness and headaches.  Psychiatric/Behavioral: Negative for agitation and dysphoric mood.       Objective:    Physical Exam Vitals reviewed.  Constitutional:      General:  She is not in acute distress.    Appearance: Normal appearance.  HENT:     Head: Normocephalic and atraumatic.     Right Ear: External ear normal.     Left Ear: External ear normal.     Mouth/Throat:     Mouth: Oropharynx is clear and moist.  Eyes:     General: No scleral icterus.       Right eye: No discharge.        Left eye: No discharge.     Conjunctiva/sclera: Conjunctivae normal.  Neck:     Thyroid: No thyromegaly.  Cardiovascular:     Rate and Rhythm: Normal rate and regular rhythm.  Pulmonary:     Effort: No respiratory distress.     Breath sounds: Normal breath sounds. No wheezing.  Abdominal:     General: Bowel sounds are normal.     Palpations: Abdomen is soft.     Tenderness: There is no abdominal tenderness.  Musculoskeletal:        General: No swelling, tenderness or edema.     Cervical back: Neck supple. No tenderness.  Lymphadenopathy:     Cervical: No cervical adenopathy.  Skin:    Findings: No erythema or rash.  Neurological:     Mental Status: She is alert.  Psychiatric:        Mood and Affect: Mood normal.        Behavior: Behavior normal.     BP 110/62   Pulse 77   Temp 98.4 F (36.9 C) (Oral)   Resp 16   Ht 5\' 7"  (1.702 m)   Wt 138 lb (62.6 kg)   SpO2 98%   BMI 21.61 kg/m  Wt Readings from Last 3 Encounters:  12/11/20 138 lb (62.6 kg)  09/09/20 154 lb 3.2 oz (69.9 kg)  11/20/19 155 lb (70.3 kg)     Lab Results  Component Value Date   WBC 6.9 09/09/2020   HGB 13.0 09/09/2020   HCT 37.6 09/09/2020   PLT 193.0 09/09/2020   GLUCOSE 84 09/09/2020   CHOL 201 (H) 11/16/2019   TRIG 184.0 (H) 11/16/2019   HDL 49.80 11/16/2019   LDLCALC 114 (H) 11/16/2019   ALT 13 09/09/2020   AST 13 09/09/2020   NA 138 09/09/2020   K 3.9 09/09/2020   CL 102 09/09/2020   CREATININE 0.82 09/09/2020   BUN 22 09/09/2020   CO2 29 09/09/2020   TSH 2.09 09/09/2020    MM 3D SCREEN BREAST BILATERAL  Result Date: 01/11/2020 CLINICAL DATA:  Screening.  EXAM: DIGITAL SCREENING BILATERAL MAMMOGRAM WITH TOMO AND CAD COMPARISON:  None. ACR Breast Density Category d: The breast tissue is extremely dense, which lowers the sensitivity of mammography. FINDINGS: There are no findings suspicious **Note De-Identified Plaisted Obfuscation** for malignancy. Images were processed with CAD. IMPRESSION: No mammographic evidence of malignancy. A result letter of this screening mammogram will be mailed directly to the patient. RECOMMENDATION: Screening mammogram in one year. (Code:SM-B-01Y) BI-RADS CATEGORY  1: Negative. Electronically Signed   By: Ammie Ferrier M.D.   On: 01/11/2020 13:31       Assessment & Plan:   Problem List Items Addressed This Visit    Extremity numbness    NCS - CTS.  Planning for injections.       Hypertriglyceridemia    Low carb diet and exercise.  Follow lipid panel.       Hypothyroidism    On thyroid replacement.  Follow tsh.       Right thyroid nodule    S/p biopsy.  Saw endocrinology.  Wanted to review pathology report.  F/u with endocrinology.       Vitamin D deficiency    Continue vitamin D supplements.           Einar Pheasant, MD

## 2020-12-13 ENCOUNTER — Encounter: Payer: Self-pay | Admitting: Internal Medicine

## 2020-12-13 NOTE — Assessment & Plan Note (Signed)
**Note De-Identified Koren Obfuscation** S/p biopsy.  Saw endocrinology.  Wanted to review pathology report.  F/u with endocrinology.

## 2020-12-13 NOTE — Assessment & Plan Note (Signed)
**Note De-identified Orndoff Obfuscation** Continue vitamin D supplements.  

## 2020-12-13 NOTE — Assessment & Plan Note (Signed)
**Note De-identified Schlagel Obfuscation** Low carb diet and exercise.  Follow lipid panel.  

## 2020-12-13 NOTE — Assessment & Plan Note (Signed)
**Note De-identified Bey Obfuscation** On thyroid replacement.  Follow tsh.  

## 2020-12-13 NOTE — Assessment & Plan Note (Signed)
**Note De-Identified Swaney Obfuscation** NCS - CTS.  Planning for injections.

## 2020-12-17 DIAGNOSIS — G5603 Carpal tunnel syndrome, bilateral upper limbs: Secondary | ICD-10-CM | POA: Diagnosis not present

## 2020-12-24 ENCOUNTER — Telehealth: Payer: Self-pay | Admitting: Internal Medicine

## 2020-12-24 NOTE — Telephone Encounter (Signed)
**Note De-Identified Alwin Obfuscation** My chart message sent to [facility] regarding thyroid biopsy.

## 2021-02-02 ENCOUNTER — Other Ambulatory Visit: Payer: Self-pay | Admitting: Internal Medicine

## 2021-02-11 NOTE — Telephone Encounter (Signed)
**Note De-Identified Oldenkamp Obfuscation** Felicia with [facility] called and asked to get pathology on thyroid refaxed to [phone]

## 2021-02-12 NOTE — Telephone Encounter (Signed)
**Note De-Identified Sonn Obfuscation** I do not see the thyroid biopsy scanned into the chart. Just wanted to make sure I am not over looking it. I can request another copy from Dr Harlow Asa if needed.

## 2021-02-12 NOTE — Telephone Encounter (Signed)
**Note De-Identified Emberton Obfuscation** Records requested asap from Dr Harlow Asa

## 2021-02-12 NOTE — Telephone Encounter (Signed)
**Note De-Identified Weaber Obfuscation** I do not see it in our system.  If you can call them and get them to re fax so that they will have a copy at [place].  See secure chat message.  Thanks.

## 2021-02-13 NOTE — Telephone Encounter (Signed)
**Note De-Identified Mancino Obfuscation** Faxed to endocrinology

## 2021-02-15 ENCOUNTER — Encounter: Payer: Self-pay | Admitting: Internal Medicine

## 2021-02-16 MED ORDER — "BD LUER-LOK SYRINGE 25G X 1"" 3 ML MISC": 3 refills | Status: DC

## 2021-02-23 DIAGNOSIS — G5603 Carpal tunnel syndrome, bilateral upper limbs: Secondary | ICD-10-CM | POA: Diagnosis not present

## 2021-04-20 ENCOUNTER — Ambulatory Visit: Payer: BC Managed Care – PPO | Admitting: Internal Medicine

## 2021-05-18 ENCOUNTER — Other Ambulatory Visit: Payer: Self-pay | Admitting: Internal Medicine

## 2021-06-17 ENCOUNTER — Other Ambulatory Visit: Payer: Self-pay | Admitting: Internal Medicine

## 2021-07-09 ENCOUNTER — Telehealth: Payer: Self-pay | Admitting: Internal Medicine

## 2021-07-09 ENCOUNTER — Other Ambulatory Visit: Payer: Self-pay

## 2021-07-09 ENCOUNTER — Ambulatory Visit: Payer: BC Managed Care – PPO | Admitting: Internal Medicine

## 2021-07-09 ENCOUNTER — Encounter: Payer: Self-pay | Admitting: Internal Medicine

## 2021-07-09 VITALS — BP 114/76 | HR 86 | Ht 67.01 in | Wt 144.4 lb

## 2021-07-09 DIAGNOSIS — E041 Nontoxic single thyroid nodule: Secondary | ICD-10-CM

## 2021-07-09 DIAGNOSIS — E559 Vitamin D deficiency, unspecified: Secondary | ICD-10-CM | POA: Diagnosis not present

## 2021-07-09 DIAGNOSIS — E038 Other specified hypothyroidism: Secondary | ICD-10-CM | POA: Diagnosis not present

## 2021-07-09 DIAGNOSIS — R200 Anesthesia of skin: Secondary | ICD-10-CM

## 2021-07-09 DIAGNOSIS — R5383 Other fatigue: Secondary | ICD-10-CM | POA: Diagnosis not present

## 2021-07-09 DIAGNOSIS — E781 Pure hyperglyceridemia: Secondary | ICD-10-CM

## 2021-07-09 MED ORDER — "BD LUER-LOK SYRINGE 25G X 1"" 3 ML MISC": 3 refills | Status: DC

## 2021-07-09 NOTE — Progress Notes (Signed)
**Note De-Identified Ricco Obfuscation** Patient ID: Sandy Jefferson, female   DOB: 1981/02/02, 40 y.o.   MRN: [id number]   Subjective:    Patient ID: Sandy Jefferson, female    DOB: 1980/11/15, 40 y.o.   MRN: [id number]  This visit occurred during the SARS-CoV-2 public health emergency.  Safety protocols were in place, including screening questions prior to the visit, additional usage of staff PPE, and extensive cleaning of exam room while observing appropriate contact time as indicated for disinfecting solutions.   Patient here for a scheduled follow up. Marland Kitchen   HPI Here to follow up regarding her thyroid.  Also has noticed burning involving the tip of her tongue.  Noticed some metal taste.  Some night sweats.  Not sleeping well.  Increased fatigue.  Right lateral wrist pain.  Has had hand numbness and tingling.  Seeing neurology.  Status post right carpal tunnel injection February 2022.  May have noted mild improvement.  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  She has follow-up with Dr. Manuella Ghazi soon.  No chest pain or shortness of breath.  No vomiting reported.  Bowel stable.  Past Medical History:  Diagnosis Date   Bladder troubles    Migraine headache    Past Surgical History:  Procedure Laterality Date   CESAREAN SECTION     HERNIA REPAIR     HYSTEROSCOPY     Family History  Problem Relation Age of Onset   Heart disease Brother    Hyperlipidemia Mother    Hyperlipidemia Father    Cancer Maternal Aunt        breast   Breast cancer Maternal Aunt        late 40's   Stroke Maternal Grandmother    Breast cancer Maternal Grandmother        great   Ovarian cancer Maternal Grandmother    Colon cancer Maternal Grandmother    Hyperlipidemia Maternal Grandfather    Cancer Paternal Grandmother        lung   Diabetes Paternal Grandmother    Diabetes Paternal Grandfather    Colon cancer Other        great grandmother   Social History   Socioeconomic History   Marital status:  Married    Spouse name: Not on file   Number of children: 4   Years of education: Not on file   Highest education level: Not on file  Occupational History   Not on file  Tobacco Use   Smoking status: Never   Smokeless tobacco: Never  Substance and Sexual Activity   Alcohol use: Yes    Alcohol/week: 0.0 standard drinks    Comment: rarely   Drug use: No   Sexual activity: Not on file  Other Topics Concern   Not on file  Social History Narrative   Not on file   Social Determinants of Health   Financial Resource Strain: Not on file  Food Insecurity: Not on file  Transportation Needs: Not on file  Physical Activity: Not on file  Stress: Not on file  Social Connections: Not on file    Review of Systems  Constitutional:  Positive for fatigue. Negative for appetite change.       Concern regarding weight gain.   HENT:  Negative for congestion and sinus pressure.   Respiratory:  Negative for cough, chest tightness and shortness of breath.   Cardiovascular:  Negative for chest pain, palpitations and leg swelling.  Gastrointestinal:  Negative for abdominal **Note De-Identified Ciavarella Obfuscation** pain, diarrhea, nausea and vomiting.  Genitourinary:  Negative for difficulty urinating and dysuria.  Musculoskeletal:  Negative for joint swelling and myalgias.  Skin:  Negative for color change and rash.  Neurological:  Negative for dizziness, light-headedness and headaches.  Psychiatric/Behavioral:  Positive for sleep disturbance. Negative for agitation and dysphoric mood.       Objective:     BP 114/76   Pulse 86   Ht 5' 7.01" (1.702 m)   Wt 144 lb 6.4 oz (65.5 kg)   SpO2 98%   BMI 22.61 kg/m  Wt Readings from Last 3 Encounters:  07/09/21 144 lb 6.4 oz (65.5 kg)  12/11/20 138 lb (62.6 kg)  09/09/20 154 lb 3.2 oz (69.9 kg)    Physical Exam Vitals reviewed.  Constitutional:      General: She is not in acute distress.    Appearance: Normal appearance.  HENT:     Head: Normocephalic and atraumatic.     Right  Ear: External ear normal.     Left Ear: External ear normal.  Eyes:     General: No scleral icterus.       Right eye: No discharge.        Left eye: No discharge.     Conjunctiva/sclera: Conjunctivae normal.  Neck:     Thyroid: No thyromegaly.  Cardiovascular:     Rate and Rhythm: Normal rate and regular rhythm.  Pulmonary:     Effort: No respiratory distress.     Breath sounds: Normal breath sounds. No wheezing.  Abdominal:     General: Bowel sounds are normal.     Palpations: Abdomen is soft.     Tenderness: There is no abdominal tenderness.  Musculoskeletal:        General: No swelling or tenderness.     Cervical back: Neck supple. No tenderness.  Lymphadenopathy:     Cervical: No cervical adenopathy.  Skin:    Findings: No erythema or rash.  Neurological:     Mental Status: She is alert.  Psychiatric:        Mood and Affect: Mood normal.        Behavior: Behavior normal.     Outpatient Encounter Medications as of 07/09/2021  Medication Sig   Alpha-Lipoic Acid 600 MG TABS Take 600 mg by mouth daily.   cyanocobalamin (,VITAMIN B-12,) 1000 MCG/ML injection INJECT 1 ML (1000 MCG) INTO THE MUSCLE EVERY 30 DAYS, USE NEEDLES AND SYRINGES FOR ADMINISTRATION   estradiol (ESTRACE) 1 MG tablet TAKE 1 TABLET DAILY   levothyroxine (SYNTHROID) 50 MCG tablet TAKE 1 TABLET DAILY BEFORE BREAKFAST   pantoprazole (PROTONIX) 40 MG tablet TAKE 1 TABLET DAILY   [DISCONTINUED] SYRINGE-NEEDLE, DISP, 3 ML (B-D 3CC LUER-LOK SYR 25GX1") 25G X 1" 3 ML MISC USE 1 SYRINGE / NEEDLE SET FOR EACH B12 ADMINISTRATION ONCE A MONTH   SYRINGE-NEEDLE, DISP, 3 ML (B-D 3CC LUER-LOK SYR 25GX1") 25G X 1" 3 ML MISC USE 1 SYRINGE / NEEDLE SET FOR EACH B12 ADMINISTRATION ONCE A MONTH   No facility-administered encounter medications on file as of 07/09/2021.     Lab Results  Component Value Date   WBC 6.9 09/09/2020   HGB 13.0 09/09/2020   HCT 37.6 09/09/2020   PLT 193.0 09/09/2020   GLUCOSE 84 09/09/2020    CHOL 201 (H) 11/16/2019   TRIG 184.0 (H) 11/16/2019   HDL 49.80 11/16/2019   LDLCALC 114 (H) 11/16/2019   ALT 13 09/09/2020   AST 13 09/09/2020 **Note De-Identified Hoos Obfuscation** NA 138 09/09/2020   K 3.9 09/09/2020   CL 102 09/09/2020   CREATININE 0.82 09/09/2020   BUN 22 09/09/2020   CO2 29 09/09/2020   TSH 2.09 09/09/2020    MM 3D SCREEN BREAST BILATERAL  Result Date: 01/11/2020 CLINICAL DATA:  Screening. EXAM: DIGITAL SCREENING BILATERAL MAMMOGRAM WITH TOMO AND CAD COMPARISON:  None. ACR Breast Density Category d: The breast tissue is extremely dense, which lowers the sensitivity of mammography. FINDINGS: There are no findings suspicious for malignancy. Images were processed with CAD. IMPRESSION: No mammographic evidence of malignancy. A result letter of this screening mammogram will be mailed directly to the patient. RECOMMENDATION: Screening mammogram in one year. (Code:SM-B-01Y) BI-RADS CATEGORY  1: Negative. Electronically Signed   By: Ammie Ferrier M.D.   On: 01/11/2020 13:31       Assessment & Plan:   Problem List Items Addressed This Visit     Extremity numbness    Saw neurology.  NCS c/w CTS.  Recommended splints and alpha lipoic acid.  S/p injection.  Has f/u soon.        Fatigue    Increased fatigue as outlined.  Check routine labs including TSH and vitamin D level.       Relevant Orders   CBC with Differential/Platelet   Comprehensive metabolic panel   TSH   Hypertriglyceridemia    Low carb diet and exercise.  Follow lipid panel.       Relevant Orders   Lipid panel   Hypothyroidism - Primary    She feels symptoms are c/w her thyroid.  Check tsh.        Right thyroid nodule    Had biopsy 10/2014.  Saw endocrinology.  Wanted to review pathology report. Will refax.  Follow TFTs.        Vitamin D deficiency    Recheck vitamin D level       Relevant Orders   VITAMIN D 25 Hydroxy (Vit-D Deficiency, Fractures)     Einar Pheasant, MD

## 2021-07-09 NOTE — Telephone Encounter (Signed)
**Note De-Identified Escareno Obfuscation** Patient scheduled for labs in October as requested by check out note.   Needing orders placed.

## 2021-07-10 ENCOUNTER — Ambulatory Visit: Payer: BC Managed Care – PPO | Admitting: Internal Medicine

## 2021-07-11 NOTE — Telephone Encounter (Signed)
**Note De-identified Owusu Obfuscation** Orders placed for f/u labs.  

## 2021-07-18 ENCOUNTER — Encounter: Payer: Self-pay | Admitting: Internal Medicine

## 2021-07-18 ENCOUNTER — Telehealth: Payer: Self-pay | Admitting: Internal Medicine

## 2021-07-18 NOTE — Assessment & Plan Note (Signed)
**Note De-identified Wilkie Obfuscation** Recheck vitamin D level 

## 2021-07-18 NOTE — Assessment & Plan Note (Signed)
**Note De-Identified Hoch Obfuscation** She feels symptoms are c/w her thyroid.  Check tsh.

## 2021-07-18 NOTE — Assessment & Plan Note (Addendum)
**Note De-Identified Rathert Obfuscation** Saw neurology.  NCS c/w CTS.  Recommended splints and alpha lipoic acid.  S/p injection.  Has f/u soon.

## 2021-07-18 NOTE — Assessment & Plan Note (Signed)
**Note De-Identified Wynter Obfuscation** Increased fatigue as outlined.  Check routine labs including TSH and vitamin D level.

## 2021-07-18 NOTE — Telephone Encounter (Signed)
**Note De-Identified Mckibbin Obfuscation** Please fax thyroid biopsy to endocrinology [facility]) - Dr Solum/HIliary.  Pathology report is 11/11/2014.  Please notify them it is being sent and confirm they received.  See me about this.

## 2021-07-18 NOTE — Assessment & Plan Note (Signed)
**Note De-Identified Alvis Obfuscation** Had biopsy 10/2014.  Saw endocrinology.  Wanted to review pathology report. Will refax.  Follow TFTs.

## 2021-07-18 NOTE — Assessment & Plan Note (Signed)
**Note De-identified Geidel Obfuscation** Low carb diet and exercise.  Follow lipid panel.  

## 2021-07-22 NOTE — Telephone Encounter (Signed)
**Note De-Identified Nole Obfuscation** Faxed to [facility] endcrinology with attention to Dr Gabriel Carina and Deidre Ala

## 2021-07-22 NOTE — Telephone Encounter (Signed)
**Note De-Identified Millett Obfuscation** Spoke with endocrinology to let them know it was faxed. They are supposed to call if they do not receive. Patient is aware and says she will follow up with them as well.

## 2021-07-23 NOTE — Telephone Encounter (Signed)
**Note De-Identified Nicosia Obfuscation** Calling back in to state they did not get results. Refaxed ultrasound and biopsy results [facility] epic routing

## 2021-07-28 ENCOUNTER — Other Ambulatory Visit: Payer: Self-pay

## 2021-07-28 ENCOUNTER — Other Ambulatory Visit (INDEPENDENT_AMBULATORY_CARE_PROVIDER_SITE_OTHER): Payer: BC Managed Care – PPO

## 2021-07-28 DIAGNOSIS — R5383 Other fatigue: Secondary | ICD-10-CM

## 2021-07-28 DIAGNOSIS — E781 Pure hyperglyceridemia: Secondary | ICD-10-CM

## 2021-07-28 DIAGNOSIS — E559 Vitamin D deficiency, unspecified: Secondary | ICD-10-CM

## 2021-07-28 LAB — COMPREHENSIVE METABOLIC PANEL
ALT: 18 U/L (ref 0–35)
AST: 14 U/L (ref 0–37)
Albumin: 4.2 g/dL (ref 3.5–5.2)
Alkaline Phosphatase: 35 U/L — ABNORMAL LOW (ref 39–117)
BUN: 23 mg/dL (ref 6–23)
CO2: 29 mEq/L (ref 19–32)
Calcium: 9.1 mg/dL (ref 8.4–10.5)
Chloride: 103 mEq/L (ref 96–112)
Creatinine, Ser: 0.97 mg/dL (ref 0.40–1.20)
GFR: 72.23 mL/min (ref >60.00)
Glucose, Bld: 87 mg/dL (ref 70–99)
Potassium: 4.2 mEq/L (ref 3.5–5.1)
Sodium: 138 mEq/L (ref 135–145)
Total Bilirubin: 0.8 mg/dL (ref 0.2–1.2)
Total Protein: 6.5 g/dL (ref 6.0–8.3)

## 2021-07-28 LAB — CBC WITH DIFFERENTIAL/PLATELET
Basophils Absolute: 0 10*3/uL (ref 0.0–0.1)
Basophils Relative: 1 % (ref 0.0–3.0)
Eosinophils Absolute: 0.2 10*3/uL (ref 0.0–0.7)
Eosinophils Relative: 4.9 % (ref 0.0–5.0)
HCT: 38.2 % (ref 36.0–46.0)
Hemoglobin: 12.7 g/dL (ref 12.0–15.0)
Lymphocytes Relative: 34.5 % (ref 12.0–46.0)
Lymphs Abs: 1.6 10*3/uL (ref 0.7–4.0)
MCHC: 33.3 g/dL (ref 30.0–36.0)
MCV: 91 fl (ref 78.0–100.0)
Monocytes Absolute: 0.4 10*3/uL (ref 0.1–1.0)
Monocytes Relative: 8.1 % (ref 3.0–12.0)
Neutro Abs: 2.5 10*3/uL (ref 1.4–7.7)
Neutrophils Relative %: 51.5 % (ref 43.0–77.0)
Platelets: 206 10*3/uL (ref 150.0–400.0)
RBC: 4.2 Mil/uL (ref 3.87–5.11)
RDW: 13.2 % (ref 11.5–15.5)
WBC: 4.8 10*3/uL (ref 4.0–10.5)

## 2021-07-28 LAB — LIPID PANEL
Cholesterol: 225 mg/dL — ABNORMAL HIGH (ref 0–200)
HDL: 53.5 mg/dL (ref >39.00)
LDL Cholesterol: 155 mg/dL — ABNORMAL HIGH (ref 0–99)
NonHDL: 171.95
Total CHOL/HDL Ratio: 4
Triglycerides: 84 mg/dL (ref 0.0–149.0)
VLDL: 16.8 mg/dL (ref 0.0–40.0)

## 2021-07-28 LAB — TSH: TSH: 2.91 u[IU]/mL (ref 0.35–5.50)

## 2021-07-28 LAB — VITAMIN D 25 HYDROXY (VIT D DEFICIENCY, FRACTURES): VITD: 31.03 ng/mL (ref 30.00–100.00)

## 2021-07-28 NOTE — Telephone Encounter (Signed)
**Note De-Identified Fluitt Obfuscation** Calling back in and they state that [facility] End[facility] received the ultrasound and ultrasound guided biopsy report. States they need the pathology results from the biopsy. This has been faxed over today.

## 2021-08-31 DIAGNOSIS — R200 Anesthesia of skin: Secondary | ICD-10-CM | POA: Diagnosis not present

## 2021-08-31 DIAGNOSIS — R202 Paresthesia of skin: Secondary | ICD-10-CM | POA: Diagnosis not present

## 2021-08-31 DIAGNOSIS — G5603 Carpal tunnel syndrome, bilateral upper limbs: Secondary | ICD-10-CM | POA: Diagnosis not present

## 2021-10-09 ENCOUNTER — Ambulatory Visit (INDEPENDENT_AMBULATORY_CARE_PROVIDER_SITE_OTHER): Payer: BC Managed Care – PPO | Admitting: Internal Medicine

## 2021-10-09 ENCOUNTER — Encounter: Payer: Self-pay | Admitting: Internal Medicine

## 2021-10-09 ENCOUNTER — Other Ambulatory Visit: Payer: Self-pay

## 2021-10-09 ENCOUNTER — Telehealth: Payer: Self-pay | Admitting: Internal Medicine

## 2021-10-09 VITALS — BP 120/72 | HR 88 | Temp 98.0°F | Resp 16 | Ht 67.0 in | Wt 148.0 lb

## 2021-10-09 DIAGNOSIS — R200 Anesthesia of skin: Secondary | ICD-10-CM

## 2021-10-09 DIAGNOSIS — Z0000 Encounter for general adult medical examination without abnormal findings: Secondary | ICD-10-CM

## 2021-10-09 DIAGNOSIS — E538 Deficiency of other specified B group vitamins: Secondary | ICD-10-CM

## 2021-10-09 DIAGNOSIS — E038 Other specified hypothyroidism: Secondary | ICD-10-CM | POA: Diagnosis not present

## 2021-10-09 DIAGNOSIS — E041 Nontoxic single thyroid nodule: Secondary | ICD-10-CM

## 2021-10-09 DIAGNOSIS — Z1231 Encounter for screening mammogram for malignant neoplasm of breast: Secondary | ICD-10-CM

## 2021-10-09 DIAGNOSIS — E781 Pure hyperglyceridemia: Secondary | ICD-10-CM | POA: Diagnosis not present

## 2021-10-09 NOTE — Assessment & Plan Note (Signed)
**Note De-Identified Wickard Obfuscation** Had biopsy 10/2014.  Saw endocrinology.  Wanted to review pathology report.  Have faxed to endocrinology.  Copy of pathology given to her.  Schedule f/u appt.  Follow TFTs.

## 2021-10-09 NOTE — Assessment & Plan Note (Signed)
**Note De-identified Christopoulos Obfuscation** On thyroid replacement.  Follow tsh.  

## 2021-10-09 NOTE — Assessment & Plan Note (Addendum)
**Note De-Identified Warrior Obfuscation** Physical today 10/09/21.  Due mammogram.  She will schedule.  S/p hysterectomy.

## 2021-10-09 NOTE — Assessment & Plan Note (Signed)
**Note De-identified Tetreault Obfuscation** Receiving injections at home.  Follow B12 level.  

## 2021-10-09 NOTE — Telephone Encounter (Signed)
**Note De-Identified Bernhart Obfuscation** Needs a f/u appt with endocrinology  [facility] endocrinology) - f/u thyroid nodule.  Please fax pathology appt.  (She has seen them previously).

## 2021-10-09 NOTE — Assessment & Plan Note (Signed)
**Note De-Identified Bielinski Obfuscation** Saw neurology.  NCS c/w CTS.  Recommended splints and alpha lipoic acid.  S/p injection.  Splints worsened pain.  Alpha lipoic acid helping.  Follow.  Will notify me when ready for ortho referral.

## 2021-10-09 NOTE — Assessment & Plan Note (Signed)
**Note De-identified Olaes Obfuscation** Low carb diet and exercise.  Follow lipid panel.  

## 2021-10-09 NOTE — Progress Notes (Signed)
**Note De-Identified Burdell Obfuscation** Patient ID: Sandy Jefferson, female   DOB: August 30, 1981, 40 y.o.   MRN: [id number]   Subjective:    Patient ID: Sandy Jefferson, female    DOB: 1980-11-10, 40 y.o.   MRN: [id number]  This visit occurred during the SARS-CoV-2 public health emergency.  Safety protocols were in place, including screening questions prior to the visit, additional usage of staff PPE, and extensive cleaning of exam room while observing appropriate contact time as indicated for disinfecting solutions.   Patient here for her physical exam.   Chief Complaint  Patient presents with   Annual Exam   .   HPI Seeing neurology for f/u CTS.  Splints and injection - not helping.  Taking alpha lipoic acid.  Does help some.  Will notify me when ready for referral to ortho for evaluation for carpal tunnel release.  Stays active.  Teaching.  No chest pain or sob reported. No abdominal pain or bowel change reported.  Discussed f/u with endocrinology - for f/u thyroid.  Copy of pathology report given to her to take to appt.     Past Medical History:  Diagnosis Date   Bladder troubles    Migraine headache    Past Surgical History:  Procedure Laterality Date   CESAREAN SECTION     HERNIA REPAIR     HYSTEROSCOPY     Family History  Problem Relation Age of Onset   Heart disease Brother    Hyperlipidemia Mother    Hyperlipidemia Father    Cancer Maternal Aunt        breast   Breast cancer Maternal Aunt        late 40's   Stroke Maternal Grandmother    Breast cancer Maternal Grandmother        great   Ovarian cancer Maternal Grandmother    Colon cancer Maternal Grandmother    Hyperlipidemia Maternal Grandfather    Cancer Paternal Grandmother        lung   Diabetes Paternal Grandmother    Diabetes Paternal Grandfather    Colon cancer Other        great grandmother   Social History   Socioeconomic History   Marital status: Married    Spouse name: Not on file   Number of children: 4   Years of education: Not on file    Highest education level: Not on file  Occupational History   Not on file  Tobacco Use   Smoking status: Never   Smokeless tobacco: Never  Substance and Sexual Activity   Alcohol use: Yes    Alcohol/week: 0.0 standard drinks    Comment: rarely   Drug use: No   Sexual activity: Not on file  Other Topics Concern   Not on file  Social History Narrative   Not on file   Social Determinants of Health   Financial Resource Strain: Not on file  Food Insecurity: Not on file  Transportation Needs: Not on file  Physical Activity: Not on file  Stress: Not on file  Social Connections: Not on file    Review of Systems  Constitutional:  Negative for appetite change and unexpected weight change.  HENT:  Negative for congestion, sinus pressure and sore throat.   Eyes:  Negative for pain and visual disturbance.  Respiratory:  Negative for cough, chest tightness and shortness of breath.   Cardiovascular:  Negative for chest pain, palpitations and leg swelling.  Gastrointestinal:  Negative for abdominal pain, diarrhea, nausea and vomiting.  Genitourinary: **Note De-Identified Medine Obfuscation** Negative for difficulty urinating and dysuria.  Musculoskeletal:  Negative for joint swelling and myalgias.       Persistent issues with carpal tunnel.    Skin:  Negative for color change and rash.  Neurological:  Negative for dizziness, light-headedness and headaches.  Hematological:  Negative for adenopathy. Does not bruise/bleed easily.  Psychiatric/Behavioral:  Negative for agitation and dysphoric mood.       Objective:     BP 120/72    Pulse 88    Temp 98 F (36.7 C)    Resp 16    Ht 5\' 7"  (1.702 m)    Wt 148 lb (67.1 kg)    SpO2 99%    BMI 23.18 kg/m  Wt Readings from Last 3 Encounters:  10/09/21 148 lb (67.1 kg)  07/09/21 144 lb 6.4 oz (65.5 kg)  12/11/20 138 lb (62.6 kg)    Physical Exam Vitals reviewed.  Constitutional:      General: She is not in acute distress.    Appearance: Normal appearance. She is well-developed.   HENT:     Head: Normocephalic and atraumatic.     Right Ear: External ear normal.     Left Ear: External ear normal.  Eyes:     General: No scleral icterus.       Right eye: No discharge.        Left eye: No discharge.     Conjunctiva/sclera: Conjunctivae normal.  Neck:     Thyroid: No thyromegaly.  Cardiovascular:     Rate and Rhythm: Normal rate and regular rhythm.  Pulmonary:     Effort: No tachypnea, accessory muscle usage or respiratory distress.     Breath sounds: Normal breath sounds. No decreased breath sounds or wheezing.  Chest:  Breasts:    Right: No inverted nipple, mass, nipple discharge or tenderness (no axillary adenopathy).     Left: No inverted nipple, mass, nipple discharge or tenderness (no axilarry adenopathy).  Abdominal:     General: Bowel sounds are normal.     Palpations: Abdomen is soft.     Tenderness: There is no abdominal tenderness.  Musculoskeletal:        General: No swelling or tenderness.     Cervical back: Neck supple.  Lymphadenopathy:     Cervical: No cervical adenopathy.  Skin:    Findings: No erythema or rash.  Neurological:     Mental Status: She is alert and oriented to person, place, and time.  Psychiatric:        Mood and Affect: Mood normal.        Behavior: Behavior normal.     Outpatient Encounter Medications as of 10/09/2021  Medication Sig   Alpha-Lipoic Acid 600 MG TABS Take 600 mg by mouth daily.   cyanocobalamin (,VITAMIN B-12,) 1000 MCG/ML injection INJECT 1 ML (1000 MCG) INTO THE MUSCLE EVERY 30 DAYS, USE NEEDLES AND SYRINGES FOR ADMINISTRATION   estradiol (ESTRACE) 1 MG tablet TAKE 1 TABLET DAILY   levothyroxine (SYNTHROID) 50 MCG tablet TAKE 1 TABLET DAILY BEFORE BREAKFAST   pantoprazole (PROTONIX) 40 MG tablet TAKE 1 TABLET DAILY   SYRINGE-NEEDLE, DISP, 3 ML (B-D 3CC LUER-LOK SYR 25GX1") 25G X 1" 3 ML MISC USE 1 SYRINGE / NEEDLE SET FOR EACH B12 ADMINISTRATION ONCE A MONTH   No facility-administered encounter  medications on file as of 10/09/2021.     Lab Results  Component Value Date   WBC 4.8 07/28/2021   HGB 12.7 07/28/2021   HCT 38.2 **Note De-Identified Mcvay Obfuscation** 07/28/2021   PLT 206.0 07/28/2021   GLUCOSE 87 07/28/2021   CHOL 225 (H) 07/28/2021   TRIG 84.0 07/28/2021   HDL 53.50 07/28/2021   LDLCALC 155 (H) 07/28/2021   ALT 18 07/28/2021   AST 14 07/28/2021   NA 138 07/28/2021   K 4.2 07/28/2021   CL 103 07/28/2021   CREATININE 0.97 07/28/2021   BUN 23 07/28/2021   CO2 29 07/28/2021   TSH 2.91 07/28/2021    MM 3D SCREEN BREAST BILATERAL  Result Date: 01/11/2020 CLINICAL DATA:  Screening. EXAM: DIGITAL SCREENING BILATERAL MAMMOGRAM WITH TOMO AND CAD COMPARISON:  None. ACR Breast Density Category d: The breast tissue is extremely dense, which lowers the sensitivity of mammography. FINDINGS: There are no findings suspicious for malignancy. Images were processed with CAD. IMPRESSION: No mammographic evidence of malignancy. A result letter of this screening mammogram will be mailed directly to the patient. RECOMMENDATION: Screening mammogram in one year. (Code:SM-B-01Y) BI-RADS CATEGORY  1: Negative. Electronically Signed   By: Ammie Ferrier M.D.   On: 01/11/2020 13:31       Assessment & Plan:   Problem List Items Addressed This Visit     B12 deficiency    Receiving injections at home.  Follow B12 level.       Extremity numbness    Saw neurology.  NCS c/w CTS.  Recommended splints and alpha lipoic acid.  S/p injection.  Splints worsened pain.  Alpha lipoic acid helping.  Follow.  Will notify me when ready for ortho referral.       Health care maintenance    Physical today 10/09/21.  Due mammogram.  She will schedule.  S/p hysterectomy.       Hypertriglyceridemia    Low carb diet and exercise.  Follow lipid panel.       Hypothyroidism    On thyroid replacement.  Follow tsh.       Right thyroid nodule    Had biopsy 10/2014.  Saw endocrinology.  Wanted to review pathology report.  Have faxed to  endocrinology.  Copy of pathology given to her.  Schedule f/u appt.  Follow TFTs.        Other Visit Diagnoses     Routine general medical examination at a health care facility    -  Primary   Encounter for screening mammogram for malignant neoplasm of breast       Relevant Orders   MM 3D SCREEN BREAST BILATERAL        Einar Pheasant, MD

## 2021-10-13 NOTE — Telephone Encounter (Signed)
**Note De-Identified Righi Obfuscation** Appt scheduled for 2/1 at 8:30 and aware of date and time. Pathology report faxed

## 2021-12-16 ENCOUNTER — Encounter: Payer: Self-pay | Admitting: Internal Medicine

## 2021-12-17 NOTE — Telephone Encounter (Signed)
**Note De-Identified Wilcoxson Obfuscation** Ok to see. (Make sure >43 years old)

## 2021-12-30 DIAGNOSIS — G5603 Carpal tunnel syndrome, bilateral upper limbs: Secondary | ICD-10-CM | POA: Diagnosis not present

## 2022-01-19 DIAGNOSIS — G5603 Carpal tunnel syndrome, bilateral upper limbs: Secondary | ICD-10-CM | POA: Diagnosis not present

## 2022-02-03 DIAGNOSIS — G5601 Carpal tunnel syndrome, right upper limb: Secondary | ICD-10-CM | POA: Diagnosis not present

## 2022-02-15 ENCOUNTER — Encounter: Payer: Self-pay | Admitting: Internal Medicine

## 2022-02-22 IMAGING — MG DIGITAL SCREENING BILAT W/ TOMO W/ CAD
8 series · 8 of 24 positions shown · non-contrast
Comparison: None.

CLINICAL DATA: Screening.

EXAM:
DIGITAL SCREENING BILATERAL MAMMOGRAM WITH TOMO AND CAD

[R CC synth-2D]
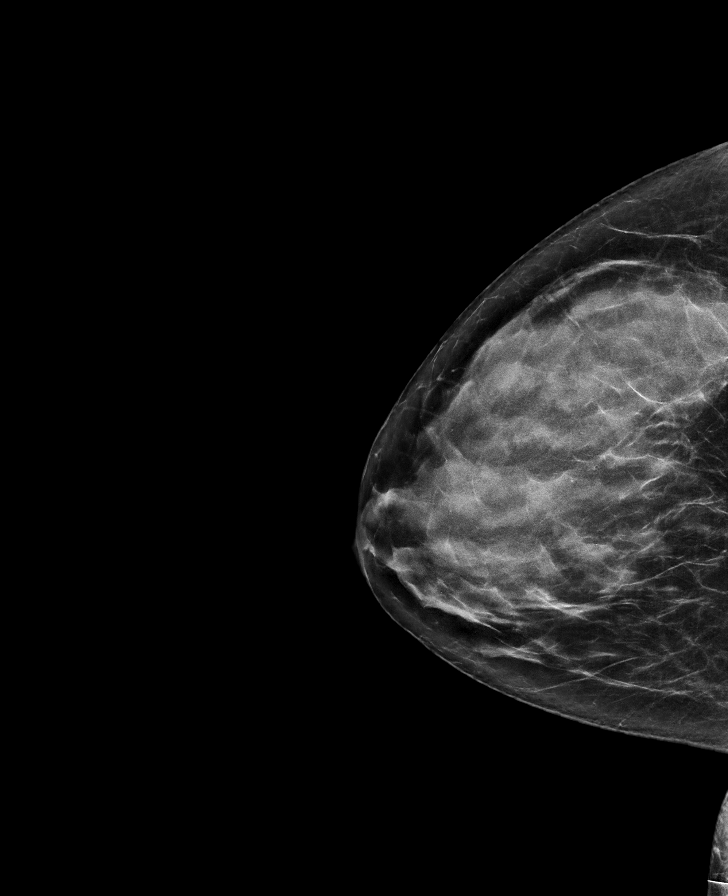

[L CC synth-2D]
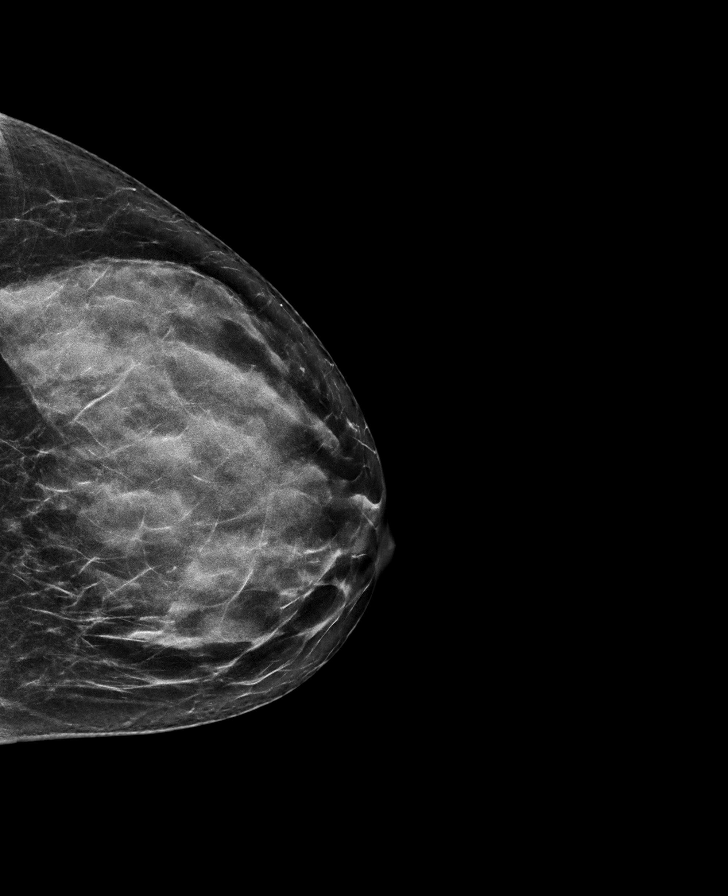

[R MLO synth-2D]
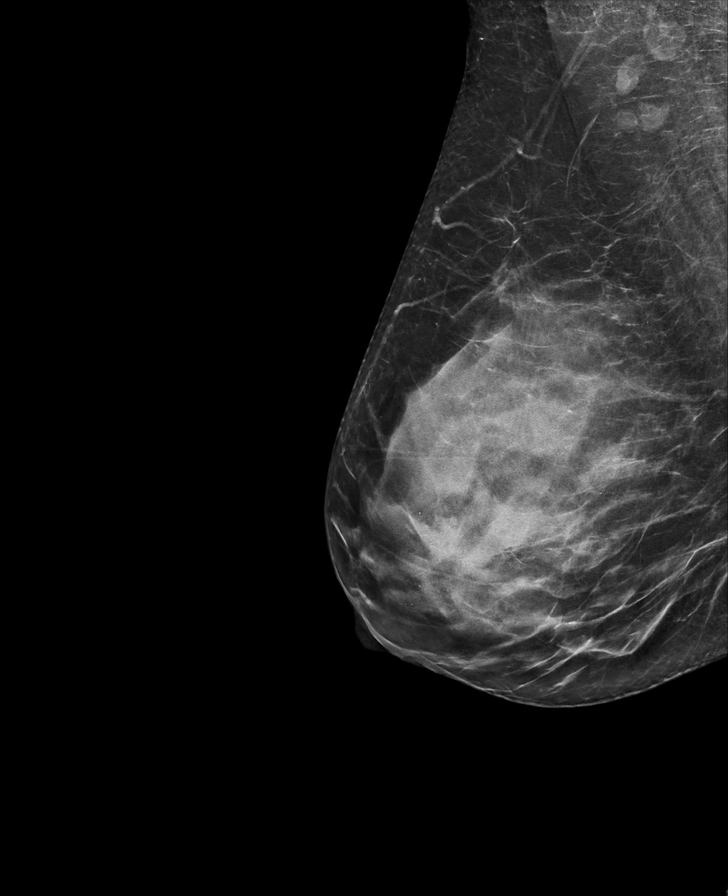

[L MLO synth-2D]
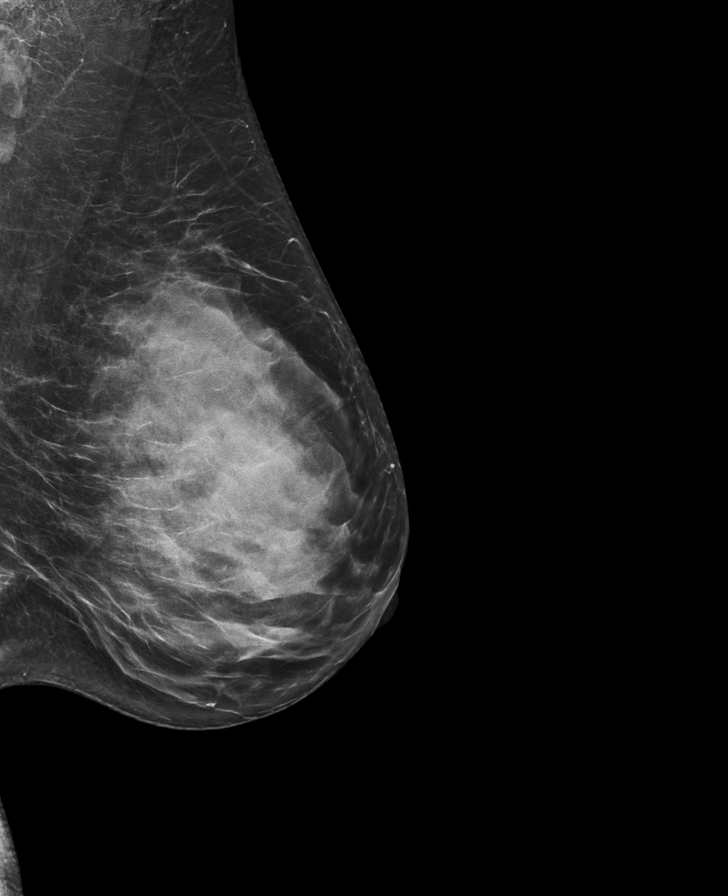

[L MLO tomo · tomo slice 37/73.0]
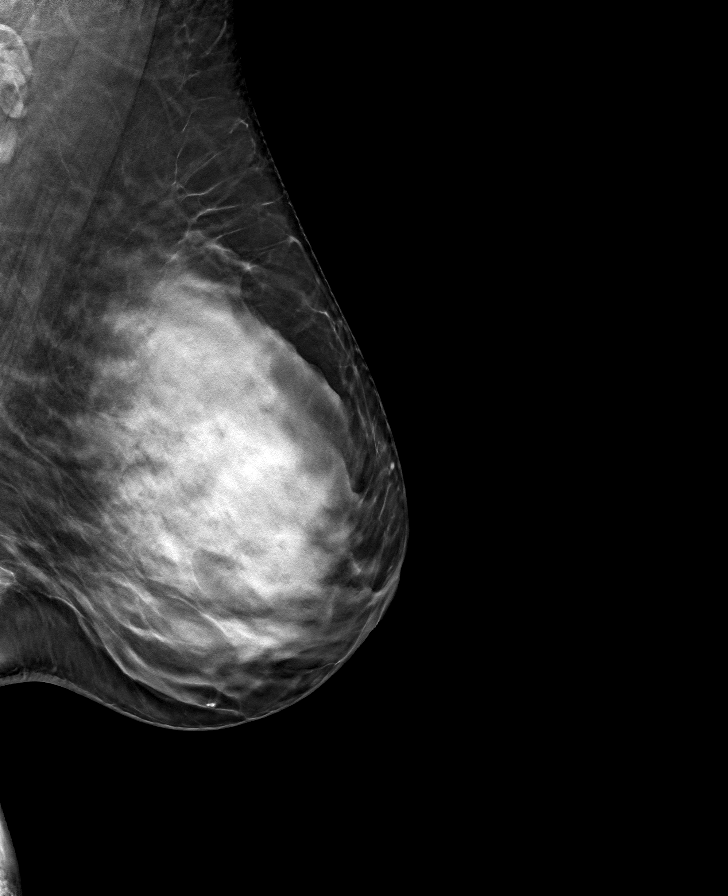

[R CC tomo · tomo slice 34/67.0]
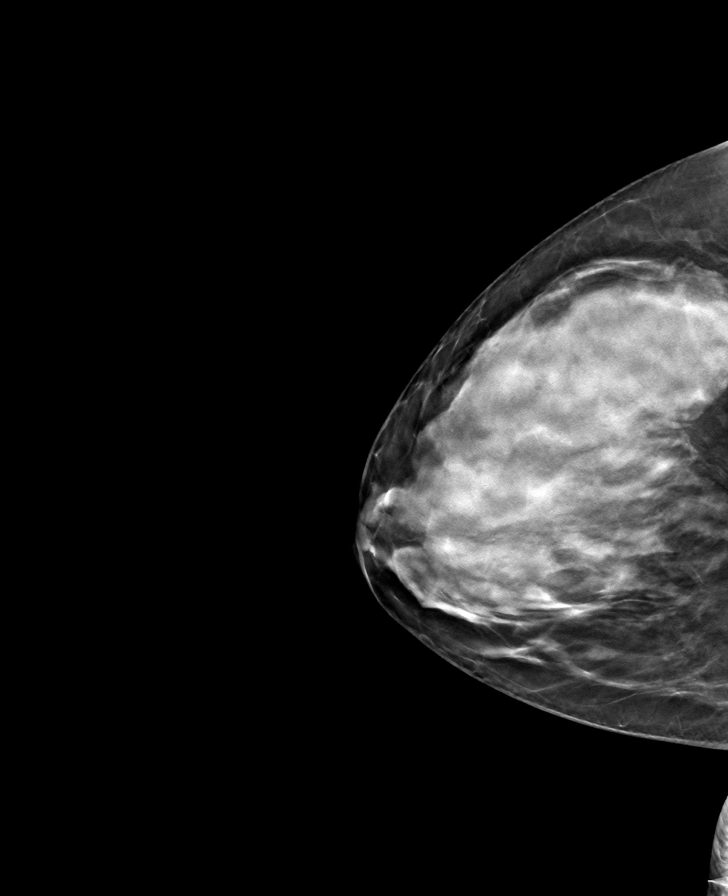

[R MLO tomo · tomo slice 35/70.0]
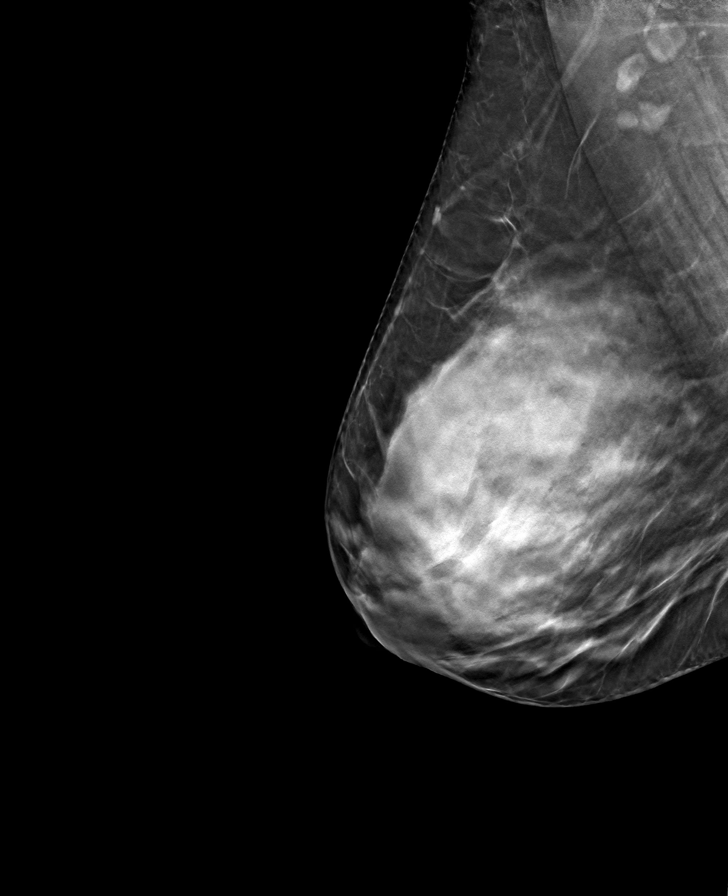

[L CC tomo · tomo slice 33/66.0]
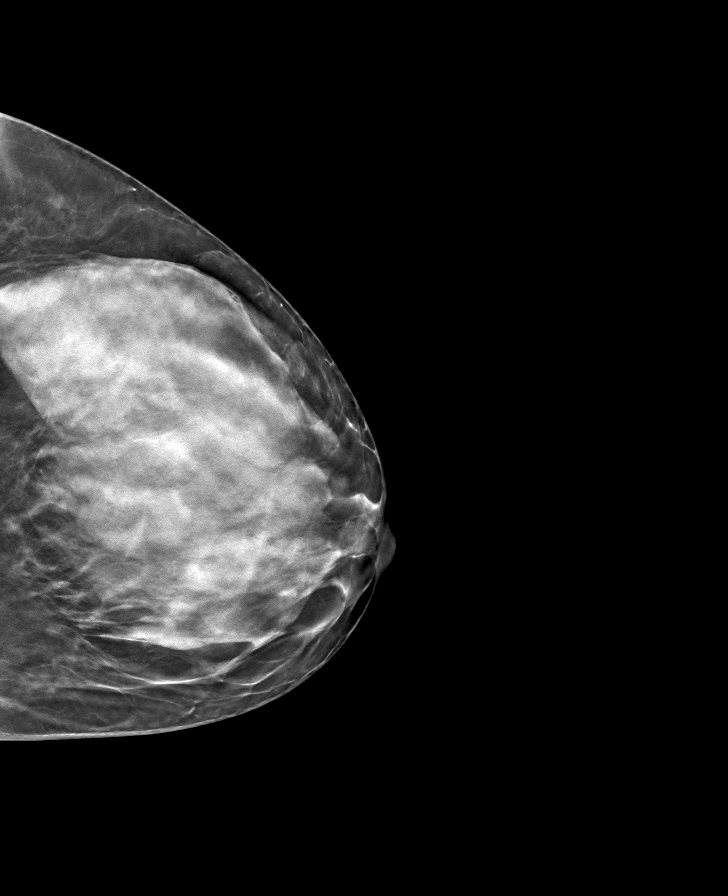

[8 of 24 positions shown; findings below may reference images not displayed]

ACR Breast Density Category d: The breast tissue is extremely dense,
which lowers the sensitivity of mammography.
FINDINGS: There are no findings suspicious for malignancy. Images were
processed with CAD.
IMPRESSION: No mammographic evidence of malignancy. A result letter of this
screening mammogram will be mailed directly to the patient.

RECOMMENDATION:
Screening mammogram in one year. (Code:F8-6-TBV)

BI-RADS CATEGORY  1: Negative.

## 2022-03-12 ENCOUNTER — Telehealth: Payer: Self-pay

## 2022-03-12 NOTE — Telephone Encounter (Signed)
**Note De-Identified Prange Obfuscation** Reminder msg sent to pt to bring path report to Endo appt tomorrow.  Also lm for pt

## 2022-03-15 DIAGNOSIS — E041 Nontoxic single thyroid nodule: Secondary | ICD-10-CM | POA: Diagnosis not present

## 2022-03-15 DIAGNOSIS — E039 Hypothyroidism, unspecified: Secondary | ICD-10-CM | POA: Diagnosis not present

## 2022-03-28 ENCOUNTER — Other Ambulatory Visit: Payer: Self-pay | Admitting: Internal Medicine

## 2022-04-05 ENCOUNTER — Telehealth: Payer: Self-pay | Admitting: Internal Medicine

## 2022-04-05 DIAGNOSIS — E781 Pure hyperglyceridemia: Secondary | ICD-10-CM

## 2022-04-05 DIAGNOSIS — E038 Other specified hypothyroidism: Secondary | ICD-10-CM

## 2022-04-05 DIAGNOSIS — E041 Nontoxic single thyroid nodule: Secondary | ICD-10-CM

## 2022-04-05 DIAGNOSIS — E559 Vitamin D deficiency, unspecified: Secondary | ICD-10-CM

## 2022-04-05 NOTE — Telephone Encounter (Signed)
**Note De-Identified Ortwein Obfuscation** Patient has a lab appt 04/08/22, there are no orders in.

## 2022-04-06 NOTE — Telephone Encounter (Signed)
**Note De-identified Gehres Obfuscation** Orders placed for labs

## 2022-04-06 NOTE — Addendum Note (Signed)
**Note De-Identified Godden Obfuscation** Addended by: Alisa Graff on: 04/06/2022 03:40 AM   Modules accepted: Orders

## 2022-04-08 ENCOUNTER — Other Ambulatory Visit (INDEPENDENT_AMBULATORY_CARE_PROVIDER_SITE_OTHER): Payer: BC Managed Care – PPO

## 2022-04-08 DIAGNOSIS — E781 Pure hyperglyceridemia: Secondary | ICD-10-CM | POA: Diagnosis not present

## 2022-04-08 DIAGNOSIS — E041 Nontoxic single thyroid nodule: Secondary | ICD-10-CM

## 2022-04-08 LAB — COMPREHENSIVE METABOLIC PANEL
ALT: 23 U/L (ref 0–35)
AST: 15 U/L (ref 0–37)
Albumin: 4.2 g/dL (ref 3.5–5.2)
Alkaline Phosphatase: 38 U/L — ABNORMAL LOW (ref 39–117)
BUN: 21 mg/dL (ref 6–23)
CO2: 28 mEq/L (ref 19–32)
Calcium: 8.8 mg/dL (ref 8.4–10.5)
Chloride: 102 mEq/L (ref 96–112)
Creatinine, Ser: 0.83 mg/dL (ref 0.40–1.20)
GFR: 86.66 mL/min (ref >60.00)
Glucose, Bld: 85 mg/dL (ref 70–99)
Potassium: 4.1 mEq/L (ref 3.5–5.1)
Sodium: 138 mEq/L (ref 135–145)
Total Bilirubin: 1.3 mg/dL — ABNORMAL HIGH (ref 0.2–1.2)
Total Protein: 6.8 g/dL (ref 6.0–8.3)

## 2022-04-08 LAB — LIPID PANEL
Cholesterol: 210 mg/dL — ABNORMAL HIGH (ref 0–200)
HDL: 58 mg/dL (ref >39.00)
LDL Cholesterol: 130 mg/dL — ABNORMAL HIGH (ref 0–99)
NonHDL: 152.03
Total CHOL/HDL Ratio: 4
Triglycerides: 109 mg/dL (ref 0.0–149.0)
VLDL: 21.8 mg/dL (ref 0.0–40.0)

## 2022-04-08 LAB — TSH: TSH: 3.56 u[IU]/mL (ref 0.35–5.50)

## 2022-04-09 ENCOUNTER — Ambulatory Visit: Payer: BC Managed Care – PPO | Admitting: Internal Medicine

## 2022-04-09 ENCOUNTER — Encounter: Payer: Self-pay | Admitting: Internal Medicine

## 2022-04-09 DIAGNOSIS — E041 Nontoxic single thyroid nodule: Secondary | ICD-10-CM | POA: Diagnosis not present

## 2022-04-09 DIAGNOSIS — E538 Deficiency of other specified B group vitamins: Secondary | ICD-10-CM | POA: Diagnosis not present

## 2022-04-09 DIAGNOSIS — E559 Vitamin D deficiency, unspecified: Secondary | ICD-10-CM

## 2022-04-09 DIAGNOSIS — E781 Pure hyperglyceridemia: Secondary | ICD-10-CM

## 2022-04-09 DIAGNOSIS — R5383 Other fatigue: Secondary | ICD-10-CM | POA: Diagnosis not present

## 2022-04-09 DIAGNOSIS — E038 Other specified hypothyroidism: Secondary | ICD-10-CM | POA: Diagnosis not present

## 2022-04-09 MED ORDER — LEVOTHYROXINE SODIUM 50 MCG PO TABS: 50.0000 ug | ORAL_TABLET | Freq: Every day | ORAL | 3 refills | Status: DC

## 2022-04-09 MED ORDER — CYANOCOBALAMIN 1000 MCG/ML IJ SOLN: INTRAMUSCULAR | 3 refills | Status: DC

## 2022-04-09 MED ORDER — "BD LUER-LOK SYRINGE 25G X 1"" 3 ML MISC": 3 refills | Status: DC

## 2022-04-09 NOTE — Progress Notes (Unsigned)
**Note De-Identified Kalish Obfuscation** Patient ID: Sandy Jefferson, female   DOB: 01-31-1981, 41 y.o.   MRN: [id number]   Subjective:    Patient ID: Sandy Jefferson, female    DOB: June 27, 1981, 25 y.o.   MRN: [id number]   Patient here for a scheduled follow up.   Chief Complaint  Patient presents with   Fatigue   .   HPI History of hypothyroidism and thyroid nodule.  Recently evaluated by Dr Gabriel Carina for f/u of above.  She underwent a core biopsy of the nodule at [facility] on 11/11/2014. The pathology report was reviewed. 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 Recommended another f/u neck ultrasound.  Planning to have in 04/2022.  Reports noticing some increased fatigue and symptoms that are similar to previous - when had issues with her thyroid.  Stays active.  No chest pain or sob reported.  Eating.  No abdominal pain or bowel change reported.  S/p CTR - 01/2022.  Still with pain.  Planning PT.     Past Medical History:  Diagnosis Date   Bladder troubles    Migraine headache    Past Surgical History:  Procedure Laterality Date   CESAREAN SECTION     HERNIA REPAIR     HYSTEROSCOPY     TOTAL ABDOMINAL HYSTERECTOMY W/ BILATERAL SALPINGOOPHORECTOMY  04/2014   Family History  Problem Relation Age of Onset   Heart disease Brother    Hyperlipidemia Mother    Hyperlipidemia Father    Cancer Maternal Aunt        breast   Breast cancer Maternal Aunt        late 40's   Stroke Maternal Grandmother    Breast cancer Maternal Grandmother        great   Ovarian cancer Maternal Grandmother    Colon cancer Maternal Grandmother    Hyperlipidemia Maternal Grandfather    Cancer Paternal Grandmother        lung   Diabetes Paternal Grandmother    Diabetes Paternal Grandfather    Colon cancer Other        great grandmother   Social History   Socioeconomic History   Marital status: Married    Spouse name: Not on file   Number of children: 4   Years of education: Not on file    Highest education level: Not on file  Occupational History   Not on file  Tobacco Use   Smoking status: Never   Smokeless tobacco: Never  Substance and Sexual Activity   Alcohol use: Yes    Alcohol/week: 0.0 standard drinks of alcohol    Comment: rarely   Drug use: No   Sexual activity: Not on file  Other Topics Concern   Not on file  Social History Narrative   Not on file   Social Determinants of Health   Financial Resource Strain: Not on file  Food Insecurity: Not on file  Transportation Needs: Not on file  Physical Activity: Not on file  Stress: Not on file  Social Connections: Not on file     Review of Systems  Constitutional:  Positive for fatigue. Negative for appetite change.  HENT:  Negative for congestion and sinus pressure.   Respiratory:  Negative for cough, chest tightness and shortness of breath.   Cardiovascular:  Negative for chest pain, palpitations and leg swelling.  Gastrointestinal:  Negative for abdominal pain, diarrhea, nausea and vomiting.  Genitourinary:  Negative for difficulty urinating and dysuria. **Note De-Identified Vessel Obfuscation** Musculoskeletal:  Negative for joint swelling and myalgias.  Skin:  Negative for color change and rash.  Neurological:  Negative for dizziness, light-headedness and headaches.  Psychiatric/Behavioral:  Negative for agitation and dysphoric mood.        Objective:     BP 122/78 (BP Location: Left Arm, Patient Position: Sitting, Cuff Size: Small)   Pulse 77   Temp 98.1 F (36.7 C) (Temporal)   Resp 14   Ht '5\' 7"'$  (1.702 m)   Wt 150 lb 4.8 oz (68.2 kg)   SpO2 97%   BMI 23.54 kg/m  Wt Readings from Last 3 Encounters:  04/09/22 150 lb 4.8 oz (68.2 kg)  10/09/21 148 lb (67.1 kg)  07/09/21 144 lb 6.4 oz (65.5 kg)    Physical Exam Vitals reviewed.  Constitutional:      General: She is not in acute distress.    Appearance: Normal appearance.  HENT:     Head: Normocephalic and atraumatic.     Right Ear: External ear normal.     Left Ear:  External ear normal.  Eyes:     General: No scleral icterus.       Right eye: No discharge.        Left eye: No discharge.     Conjunctiva/sclera: Conjunctivae normal.  Neck:     Thyroid: No thyromegaly.  Cardiovascular:     Rate and Rhythm: Normal rate and regular rhythm.  Pulmonary:     Effort: No respiratory distress.     Breath sounds: Normal breath sounds. No wheezing.  Abdominal:     General: Bowel sounds are normal.     Palpations: Abdomen is soft.     Tenderness: There is no abdominal tenderness.  Musculoskeletal:        General: No swelling or tenderness.     Cervical back: Neck supple. No tenderness.  Lymphadenopathy:     Cervical: No cervical adenopathy.  Skin:    Findings: No erythema or rash.  Neurological:     Mental Status: She is alert.  Psychiatric:        Mood and Affect: Mood normal.        Behavior: Behavior normal.      Outpatient Encounter Medications as of 04/09/2022  Medication Sig   Alpha-Lipoic Acid 600 MG TABS Take 600 mg by mouth daily.   estradiol (ESTRACE) 1 MG tablet TAKE 1 TABLET DAILY   pantoprazole (PROTONIX) 40 MG tablet TAKE 1 TABLET DAILY   [DISCONTINUED] cyanocobalamin (,VITAMIN B-12,) 1000 MCG/ML injection INJECT 1 ML (1000 MCG) INTO THE MUSCLE EVERY 30 DAYS, USE NEEDLES AND SYRINGES FOR ADMINISTRATION   [DISCONTINUED] levothyroxine (SYNTHROID) 50 MCG tablet TAKE 1 TABLET DAILY BEFORE BREAKFAST   [DISCONTINUED] SYRINGE-NEEDLE, DISP, 3 ML (B-D 3CC LUER-LOK SYR 25GX1") 25G X 1" 3 ML MISC USE 1 SYRINGE FOR EACH B12 ADMINISTRATION ONCE A MONTH   cyanocobalamin (,VITAMIN B-12,) 1000 MCG/ML injection INJECT 1 ML (1000 MCG) INTO THE MUSCLE EVERY 30 DAYS, USE NEEDLES AND SYRINGES FOR ADMINISTRATION   levothyroxine (SYNTHROID) 50 MCG tablet Take 1 tablet (50 mcg total) by mouth daily before breakfast.   SYRINGE-NEEDLE, DISP, 3 ML (B-D 3CC LUER-LOK SYR 25GX1") 25G X 1" 3 ML MISC USE 1 SYRINGE FOR EACH B12 ADMINISTRATION ONCE A MONTH   No  facility-administered encounter medications on file as of 04/09/2022.     Lab Results  Component Value Date   WBC 4.8 07/28/2021   HGB 12.7 07/28/2021   HCT 38.2 07/28/2021 **Note De-Identified Rinehimer Obfuscation** PLT 206.0 07/28/2021   GLUCOSE 85 04/08/2022   CHOL 210 (H) 04/08/2022   TRIG 109.0 04/08/2022   HDL 58.00 04/08/2022   LDLCALC 130 (H) 04/08/2022   ALT 23 04/08/2022   AST 15 04/08/2022   NA 138 04/08/2022   K 4.1 04/08/2022   CL 102 04/08/2022   CREATININE 0.83 04/08/2022   BUN 21 04/08/2022   CO2 28 04/08/2022   TSH 3.56 04/08/2022    MM 3D SCREEN BREAST BILATERAL  Result Date: 01/11/2020 CLINICAL DATA:  Screening. EXAM: DIGITAL SCREENING BILATERAL MAMMOGRAM WITH TOMO AND CAD COMPARISON:  None. ACR Breast Density Category d: The breast tissue is extremely dense, which lowers the sensitivity of mammography. FINDINGS: There are no findings suspicious for malignancy. Images were processed with CAD. IMPRESSION: No mammographic evidence of malignancy. A result letter of this screening mammogram will be mailed directly to the patient. RECOMMENDATION: Screening mammogram in one year. (Code:SM-B-01Y) BI-RADS CATEGORY  1: Negative. Electronically Signed   By: Ammie Ferrier M.D.   On: 01/11/2020 13:31       Assessment & Plan:   Problem List Items Addressed This Visit     B12 deficiency    Receiving injections at home.  Follow B12 level.       Fatigue    Reports increased fatigue as outlined.  Recent tsh wnl.  Planning for f/u regarding thyroid as outlined.       Hyperbilirubinemia    Slight increase on recent labs.  Recheck liver panel in the next few weeks to confirm stable/normal.       Hypertriglyceridemia    Low carb diet and exercise.  Follow lipid panel.       Hypothyroidism    On thyroid replacement.  Follow tsh.       Relevant Medications   levothyroxine (SYNTHROID) 50 MCG tablet   Right thyroid nodule     She underwent a core biopsy of the nodule at [facility] on 11/11/2014. The  pathology report was reviewed. 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  Recommended another f/u neck ultrasound.  Planning to have in 04/2022.       Relevant Medications   levothyroxine (SYNTHROID) 50 MCG tablet   Vitamin D deficiency    Vitamin D level wnl 07/2021.          Einar Pheasant, MD

## 2022-04-11 ENCOUNTER — Encounter: Payer: Self-pay | Admitting: Internal Medicine

## 2022-04-11 NOTE — Assessment & Plan Note (Signed)
**Note De-identified Vorhees Obfuscation** On thyroid replacement.  Follow tsh.  

## 2022-04-11 NOTE — Assessment & Plan Note (Signed)
**Note De-Identified Glanz Obfuscation** Slight increase on recent labs.  Recheck liver panel in the next few weeks to confirm stable/normal.

## 2022-04-11 NOTE — Assessment & Plan Note (Signed)
**Note De-identified Sparger Obfuscation** Low carb diet and exercise.  Follow lipid panel.  

## 2022-04-11 NOTE — Assessment & Plan Note (Signed)
**Note De-Identified Seifert Obfuscation** Reports increased fatigue as outlined.  Recent tsh wnl.  Planning for f/u regarding thyroid as outlined.

## 2022-04-11 NOTE — Assessment & Plan Note (Signed)
**Note De-Identified Lacombe Obfuscation** She underwent a core biopsy of the nodule at [facility] on 11/11/2014. The pathology report was reviewed. 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  Recommended another f/u neck ultrasound.  Planning to have in 04/2022.

## 2022-04-11 NOTE — Assessment & Plan Note (Signed)
**Note De-Identified Petersheim Obfuscation** Vitamin D level wnl 07/2021.

## 2022-04-11 NOTE — Assessment & Plan Note (Signed)
**Note De-identified Kaltenbach Obfuscation** Receiving injections at home.  Follow B12 level.  

## 2022-04-27 ENCOUNTER — Other Ambulatory Visit: Payer: Self-pay | Admitting: Internal Medicine

## 2022-04-28 ENCOUNTER — Telehealth: Payer: Self-pay | Admitting: *Deleted

## 2022-04-28 NOTE — Telephone Encounter (Signed)
**Note De-identified [place] Obfuscation** Order placed for f/u liver panel.  

## 2022-04-28 NOTE — Telephone Encounter (Signed)
**Note De-Identified Kugler Obfuscation** Please place future orders for lab appt.    Last lab results only says: hold for appt

## 2022-05-03 ENCOUNTER — Other Ambulatory Visit: Payer: BC Managed Care – PPO

## 2022-05-04 DIAGNOSIS — E041 Nontoxic single thyroid nodule: Secondary | ICD-10-CM | POA: Diagnosis not present

## 2022-05-12 ENCOUNTER — Other Ambulatory Visit: Payer: Self-pay | Admitting: Internal Medicine

## 2022-05-24 DIAGNOSIS — Z23 Encounter for immunization: Secondary | ICD-10-CM | POA: Diagnosis not present

## 2022-06-14 ENCOUNTER — Other Ambulatory Visit: Payer: Self-pay | Admitting: Internal Medicine

## 2022-08-14 ENCOUNTER — Encounter: Payer: Self-pay | Admitting: Internal Medicine

## 2022-10-11 ENCOUNTER — Ambulatory Visit: Payer: BC Managed Care – PPO | Admitting: Internal Medicine

## 2022-10-11 ENCOUNTER — Encounter: Payer: Self-pay | Admitting: Internal Medicine

## 2022-10-11 ENCOUNTER — Ambulatory Visit (INDEPENDENT_AMBULATORY_CARE_PROVIDER_SITE_OTHER): Payer: BC Managed Care – PPO

## 2022-10-11 VITALS — BP 122/84 | HR 75 | Temp 98.3°F | Ht 67.0 in | Wt 159.6 lb

## 2022-10-11 DIAGNOSIS — K5900 Constipation, unspecified: Secondary | ICD-10-CM

## 2022-10-11 DIAGNOSIS — E538 Deficiency of other specified B group vitamins: Secondary | ICD-10-CM

## 2022-10-11 DIAGNOSIS — E038 Other specified hypothyroidism: Secondary | ICD-10-CM

## 2022-10-11 DIAGNOSIS — R140 Abdominal distension (gaseous): Secondary | ICD-10-CM

## 2022-10-11 DIAGNOSIS — E781 Pure hyperglyceridemia: Secondary | ICD-10-CM

## 2022-10-11 MED ORDER — "BD LUER-LOK SYRINGE 25G X 1"" 3 ML MISC": 3 refills | Status: DC

## 2022-10-11 NOTE — Progress Notes (Signed)
**Note De-Identified Tippens Obfuscation** Subjective:    Patient ID: Sandy Jefferson, female    DOB: March 29, 1981, 41 y.o.   MRN: [id number]  Patient here for  Chief Complaint  Patient presents with   Medical Management of Chronic Issues    6 month f/u   Bloated    Abdominal bloating    HPI Here for f/u.  Concerned regarding increased abdominal bloating.  Feels more swollen.  Reports ankles and hands feel tight.  No nausea or vomiting.  Some constipation.  Discussed miralax.  Also hot tea.  Last bowel movement yesterday.  Will strain - minimal stool out.  Increased gas.  Trying to stay hydrated.  Decreased appetite.  Breathing stable.  No increased cough or congestion reported.     Past Medical History:  Diagnosis Date   Bladder troubles    Migraine headache    Past Surgical History:  Procedure Laterality Date   CESAREAN SECTION     HERNIA REPAIR     HYSTEROSCOPY     TOTAL ABDOMINAL HYSTERECTOMY W/ BILATERAL SALPINGOOPHORECTOMY  04/2014   Family History  Problem Relation Age of Onset   Heart disease Brother    Hyperlipidemia Mother    Hyperlipidemia Father    Cancer Maternal Aunt        breast   Breast cancer Maternal Aunt        late 40's   Stroke Maternal Grandmother    Breast cancer Maternal Grandmother        great   Ovarian cancer Maternal Grandmother    Colon cancer Maternal Grandmother    Hyperlipidemia Maternal Grandfather    Cancer Paternal Grandmother        lung   Diabetes Paternal Grandmother    Diabetes Paternal Grandfather    Colon cancer Other        great grandmother   Social History   Socioeconomic History   Marital status: Married    Spouse name: Not on file   Number of children: 4   Years of education: Not on file   Highest education level: Not on file  Occupational History   Not on file  Tobacco Use   Smoking status: Never   Smokeless tobacco: Never  Substance and Sexual Activity   Alcohol use: Yes    Alcohol/week: 0.0 standard drinks of alcohol    Comment: rarely   Drug  use: No   Sexual activity: Not on file  Other Topics Concern   Not on file  Social History Narrative   Not on file   Social Determinants of Health   Financial Resource Strain: Not on file  Food Insecurity: Not on file  Transportation Needs: Not on file  Physical Activity: Not on file  Stress: Not on file  Social Connections: Not on file     Review of Systems  Constitutional:  Negative for unexpected weight change.       Some decreased appetite.   HENT:  Negative for congestion and sinus pressure.   Respiratory:  Negative for cough, chest tightness and shortness of breath.   Cardiovascular:  Negative for chest pain, palpitations and leg swelling.  Gastrointestinal:  Negative for vomiting.       Decreased appetite and bloating as outined.  Some constipation.   Genitourinary:  Negative for difficulty urinating and dysuria.  Musculoskeletal:  Negative for joint swelling and myalgias.  Skin:  Negative for rash.  Neurological:  Negative for dizziness and headaches.  Psychiatric/Behavioral:  Negative for agitation and dysphoric mood. **Note De-Identified Saintjean Obfuscation** Objective:     BP 122/84   Pulse 75   Temp 98.3 F (36.8 C)   Ht _0  (1.702 m)   Wt 159 lb 9.6 oz (72.4 kg)   SpO2 98%   BMI 25.00 kg/m  Wt Readings from Last 3 Encounters:  10/11/22 159 lb 9.6 oz (72.4 kg)  04/09/22 150 lb 4.8 oz (68.2 kg)  10/09/21 148 lb (67.1 kg)    Physical Exam Vitals reviewed.  Constitutional:      General: She is not in acute distress.    Appearance: Normal appearance.  HENT:     Head: Normocephalic and atraumatic.     Right Ear: External ear normal.     Left Ear: External ear normal.     Nose: Nose normal.  Eyes:     General: No scleral icterus.       Right eye: No discharge.        Left eye: No discharge.     Conjunctiva/sclera: Conjunctivae normal.  Neck:     Thyroid: No thyromegaly.  Cardiovascular:     Rate and Rhythm: Normal rate and regular rhythm.  Pulmonary:     Effort: No  respiratory distress.     Breath sounds: Normal breath sounds. No wheezing.  Abdominal:     General: Bowel sounds are normal.     Palpations: Abdomen is soft.     Tenderness: There is no abdominal tenderness.  Musculoskeletal:        General: No swelling or tenderness.     Cervical back: Neck supple. No tenderness.  Lymphadenopathy:     Cervical: No cervical adenopathy.  Skin:    Findings: No erythema or rash.  Neurological:     Mental Status: She is alert.  Psychiatric:        Mood and Affect: Mood normal.        Behavior: Behavior normal.      Outpatient Encounter Medications as of 10/11/2022  Medication Sig   Alpha-Lipoic Acid 600 MG TABS Take 600 mg by mouth daily.   cyanocobalamin (,VITAMIN B-12,) 1000 MCG/ML injection INJECT 1 ML (1000 MCG) INTO THE MUSCLE EVERY 30 DAYS, USE NEEDLES AND SYRINGES FOR ADMINISTRATION   estradiol (ESTRACE) 1 MG tablet TAKE 1 TABLET DAILY   levothyroxine (SYNTHROID) 50 MCG tablet TAKE 1 TABLET DAILY BEFORE BREAKFAST   pantoprazole (PROTONIX) 40 MG tablet TAKE 1 TABLET DAILY   [DISCONTINUED] SYRINGE-NEEDLE, DISP, 3 ML (B-D 3CC LUER-LOK SYR 25GX1") 25G X 1" 3 ML MISC USE 1 SYRINGE FOR EACH B12 ADMINISTRATION ONCE A MONTH   SYRINGE-NEEDLE, DISP, 3 ML (B-D 3CC LUER-LOK SYR 25GX1") 25G X 1" 3 ML MISC USE 1 SYRINGE FOR EACH B12 ADMINISTRATION ONCE A MONTH   No facility-administered encounter medications on file as of 10/11/2022.     Lab Results  Component Value Date   WBC 6.3 10/11/2022   HGB 12.4 10/11/2022   HCT 35.8 (L) 10/11/2022   PLT 203.0 10/11/2022   GLUCOSE 83 10/11/2022   CHOL 210 (H) 04/08/2022   TRIG 109.0 04/08/2022   HDL 58.00 04/08/2022   LDLCALC 130 (H) 04/08/2022   ALT 13 10/11/2022   AST 13 10/11/2022   NA 140 10/11/2022   K 3.9 10/11/2022   CL 105 10/11/2022   CREATININE 0.91 10/11/2022   BUN 17 10/11/2022   CO2 30 10/11/2022   TSH 2.31 10/11/2022    MM 3D SCREEN BREAST BILATERAL  Result Date:  01/11/2020 CLINICAL DATA:  Screening. EXAM: DIGITAL **Note De-Identified Shular Obfuscation** SCREENING BILATERAL MAMMOGRAM WITH TOMO AND CAD COMPARISON:  None. ACR Breast Density Category d: The breast tissue is extremely dense, which lowers the sensitivity of mammography. FINDINGS: There are no findings suspicious for malignancy. Images were processed with CAD. IMPRESSION: No mammographic evidence of malignancy. A result letter of this screening mammogram will be mailed directly to the patient. RECOMMENDATION: Screening mammogram in one year. (Code:SM-B-01Y) BI-RADS CATEGORY  1: Negative. Electronically Signed   By: Ammie Ferrier M.D.   On: 01/11/2020 13:31       Assessment & Plan:  Abdominal bloating Assessment & Plan: Abdominal bloating with constipation.  Taking mirala/hot tea.  Staying hydrated.  Check labs, including cbc, met c and tsh.  Also, check KUB.  Further w/up pending results.    Orders: -     CBC with Differential/Platelet -     Basic metabolic panel -     Hepatic function panel -     DG Abd 1 View; Future  Constipation, unspecified constipation type Assessment & Plan: Miralax.  Continue to stay hydrated.  Check KUB.  Further w/up pending results.  Check labs.   Orders: -     TSH -     DG Abd 1 View; Future  B12 deficiency Assessment & Plan: Receiving injections at home.  Follow B12 level.    Hyperbilirubinemia Assessment & Plan: Follow liver panel.    Hypertriglyceridemia Assessment & Plan: Low carb diet and exercise.  Follow lipid panel.    Other specified hypothyroidism Assessment & Plan: On thyroid replacement.  Follow tsh.    Other orders -     BD Luer-Lok Syringe; USE 1 SYRINGE FOR EACH B12 ADMINISTRATION ONCE A MONTH  Dispense: 50 each; Refill: 3     Einar Pheasant, MD

## 2022-10-12 LAB — CBC WITH DIFFERENTIAL/PLATELET
Basophils Absolute: 0 10*3/uL (ref 0.0–0.1)
Basophils Relative: 0.6 % (ref 0.0–3.0)
Eosinophils Absolute: 0.2 10*3/uL (ref 0.0–0.7)
Eosinophils Relative: 2.5 % (ref 0.0–5.0)
HCT: 35.8 % — ABNORMAL LOW (ref 36.0–46.0)
Hemoglobin: 12.4 g/dL (ref 12.0–15.0)
Lymphocytes Relative: 32 % (ref 12.0–46.0)
Lymphs Abs: 2 10*3/uL (ref 0.7–4.0)
MCHC: 34.8 g/dL (ref 30.0–36.0)
MCV: 89.5 fl (ref 78.0–100.0)
Monocytes Absolute: 0.5 10*3/uL (ref 0.1–1.0)
Monocytes Relative: 7.4 % (ref 3.0–12.0)
Neutro Abs: 3.6 10*3/uL (ref 1.4–7.7)
Neutrophils Relative %: 57.5 % (ref 43.0–77.0)
Platelets: 203 10*3/uL (ref 150.0–400.0)
RBC: 4 Mil/uL (ref 3.87–5.11)
RDW: 12.9 % (ref 11.5–15.5)
WBC: 6.3 10*3/uL (ref 4.0–10.5)

## 2022-10-12 LAB — BASIC METABOLIC PANEL
BUN: 17 mg/dL (ref 6–23)
CO2: 30 mEq/L (ref 19–32)
Calcium: 8.7 mg/dL (ref 8.4–10.5)
Chloride: 105 mEq/L (ref 96–112)
Creatinine, Ser: 0.91 mg/dL (ref 0.40–1.20)
GFR: 77.32 mL/min (ref >60.00)
Glucose, Bld: 83 mg/dL (ref 70–99)
Potassium: 3.9 mEq/L (ref 3.5–5.1)
Sodium: 140 mEq/L (ref 135–145)

## 2022-10-12 LAB — HEPATIC FUNCTION PANEL
ALT: 13 U/L (ref 0–35)
AST: 13 U/L (ref 0–37)
Albumin: 4.1 g/dL (ref 3.5–5.2)
Alkaline Phosphatase: 39 U/L (ref 39–117)
Bilirubin, Direct: 0.1 mg/dL (ref 0.0–0.3)
Total Bilirubin: 0.4 mg/dL (ref 0.2–1.2)
Total Protein: 6.6 g/dL (ref 6.0–8.3)

## 2022-10-12 LAB — TSH: TSH: 2.31 u[IU]/mL (ref 0.35–5.50)

## 2022-10-13 ENCOUNTER — Telehealth: Payer: Self-pay | Admitting: Internal Medicine

## 2022-10-13 NOTE — Telephone Encounter (Signed)
**Note De-identified Sliger Obfuscation** Pt returning call

## 2022-10-14 ENCOUNTER — Telehealth: Payer: Self-pay | Admitting: Internal Medicine

## 2022-10-14 ENCOUNTER — Encounter: Payer: Self-pay | Admitting: Internal Medicine

## 2022-10-14 DIAGNOSIS — Z1231 Encounter for screening mammogram for malignant neoplasm of breast: Secondary | ICD-10-CM

## 2022-10-14 NOTE — Telephone Encounter (Signed)
**Note De-Identified Mcnairy Obfuscation** Received notification that she is overdue mammogram.  Need to schedule.  Order placed.

## 2022-10-14 NOTE — Telephone Encounter (Signed)
**Note De-identified Breth Obfuscation**  **Note De-Identified Starkel Obfuscation** Received notification that she is overdue mammogram.  Need to schedule.  Order placed.

## 2022-10-15 NOTE — Telephone Encounter (Signed)
**Note De-Identified Stinson Obfuscation** Mychart sent with results

## 2022-10-16 ENCOUNTER — Other Ambulatory Visit: Payer: Self-pay | Admitting: Internal Medicine

## 2022-10-16 DIAGNOSIS — R140 Abdominal distension (gaseous): Secondary | ICD-10-CM

## 2022-10-16 DIAGNOSIS — K5900 Constipation, unspecified: Secondary | ICD-10-CM

## 2022-10-23 ENCOUNTER — Encounter: Payer: Self-pay | Admitting: Internal Medicine

## 2022-10-23 NOTE — Assessment & Plan Note (Signed)
**Note De-Identified Hosea Obfuscation** Abdominal bloating with constipation.  Taking mirala/hot tea.  Staying hydrated.  Check labs, including cbc, met c and tsh.  Also, check KUB.  Further w/up pending results.

## 2022-10-23 NOTE — Addendum Note (Signed)
**Note De-Identified Uehara Obfuscation** Addended by: Alisa Graff on: 10/23/2022 02:17 PM   Modules accepted: Level of Service

## 2022-10-23 NOTE — Assessment & Plan Note (Signed)
**Note De-identified Wiberg Obfuscation** Receiving injections at home.  Follow B12 level.  

## 2022-10-23 NOTE — Assessment & Plan Note (Signed)
**Note De-Identified Pennings Obfuscation** Miralax.  Continue to stay hydrated.  Check KUB.  Further w/up pending results.  Check labs.

## 2022-10-23 NOTE — Assessment & Plan Note (Signed)
**Note De-identified Kretsch Obfuscation** Follow liver panel.  

## 2022-10-23 NOTE — Assessment & Plan Note (Signed)
**Note De-identified Bohle Obfuscation** Low carb diet and exercise.  Follow lipid panel.  

## 2022-10-23 NOTE — Assessment & Plan Note (Signed)
**Note De-identified Swiney Obfuscation** On thyroid replacement.  Follow tsh.  

## 2022-12-13 ENCOUNTER — Ambulatory Visit: Payer: BC Managed Care – PPO | Admitting: Internal Medicine

## 2022-12-13 ENCOUNTER — Encounter: Payer: Self-pay | Admitting: Internal Medicine

## 2022-12-13 VITALS — BP 124/70 | HR 86 | Temp 98.2°F | Resp 16 | Ht 67.0 in | Wt 160.6 lb

## 2022-12-13 DIAGNOSIS — E781 Pure hyperglyceridemia: Secondary | ICD-10-CM | POA: Diagnosis not present

## 2022-12-13 DIAGNOSIS — E038 Other specified hypothyroidism: Secondary | ICD-10-CM

## 2022-12-13 DIAGNOSIS — R140 Abdominal distension (gaseous): Secondary | ICD-10-CM | POA: Diagnosis not present

## 2022-12-13 DIAGNOSIS — K5900 Constipation, unspecified: Secondary | ICD-10-CM

## 2022-12-13 DIAGNOSIS — E538 Deficiency of other specified B group vitamins: Secondary | ICD-10-CM

## 2022-12-13 DIAGNOSIS — E041 Nontoxic single thyroid nodule: Secondary | ICD-10-CM

## 2022-12-13 NOTE — Progress Notes (Signed)
**Note De-Identified Parran Obfuscation** Subjective:    Patient ID: Sandy Jefferson, female    DOB: 04/07/1981, 42 y.o.   MRN: [id number]  Patient here for  Chief Complaint  Patient presents with   Medical Management of Chronic Issues    HPI Here for f/u appt.  Last visit - increased abdominal bloating.  She was concerned thyroid levels off.  TSH wnl.  On synthroid.  Had discussed miralax, hot tea and staying hydrated.  Miralax irritated her.  Still having issues.  Last bowel movement - several days ago.  Small amount this am.  States her "stomach bubbles when eats".  No nausea or vomiting.  Acid reflux.  Desires earlier GI appt.  Also persistent hand lesion.  Request referral - derm.    Past Medical History:  Diagnosis Date   Bladder troubles    Migraine headache    Past Surgical History:  Procedure Laterality Date   CESAREAN SECTION     HERNIA REPAIR     HYSTEROSCOPY     TOTAL ABDOMINAL HYSTERECTOMY W/ BILATERAL SALPINGOOPHORECTOMY  04/2014   Family History  Problem Relation Age of Onset   Heart disease Brother    Hyperlipidemia Mother    Hyperlipidemia Father    Cancer Maternal Aunt        breast   Breast cancer Maternal Aunt        late 40's   Stroke Maternal Grandmother    Breast cancer Maternal Grandmother        great   Ovarian cancer Maternal Grandmother    Colon cancer Maternal Grandmother    Hyperlipidemia Maternal Grandfather    Cancer Paternal Grandmother        lung   Diabetes Paternal Grandmother    Diabetes Paternal Grandfather    Colon cancer Other        great grandmother   Social History   Socioeconomic History   Marital status: Married    Spouse name: Not on file   Number of children: 4   Years of education: Not on file   Highest education level: Not on file  Occupational History   Not on file  Tobacco Use   Smoking status: Never   Smokeless tobacco: Never  Substance and Sexual Activity   Alcohol use: Yes    Alcohol/week: 0.0 standard drinks of alcohol    Comment: rarely    Drug use: No   Sexual activity: Not on file  Other Topics Concern   Not on file  Social History Narrative   Not on file   Social Determinants of Health   Financial Resource Strain: Not on file  Food Insecurity: Not on file  Transportation Needs: Not on file  Physical Activity: Not on file  Stress: Not on file  Social Connections: Not on file     Review of Systems  Constitutional:  Negative for appetite change and unexpected weight change.  HENT:  Negative for congestion and sinus pressure.   Respiratory:  Negative for cough, chest tightness and shortness of breath.   Cardiovascular:  Negative for chest pain and palpitations.  Gastrointestinal:  Positive for constipation. Negative for nausea and vomiting.       Abdominal bloating.    Genitourinary:  Negative for difficulty urinating and dysuria.  Musculoskeletal:  Negative for joint swelling and myalgias.  Skin:  Negative for color change and rash.  Neurological:  Negative for dizziness and headaches.  Psychiatric/Behavioral:  Negative for agitation and dysphoric mood. **Note De-Identified Haberman Obfuscation** Objective:     BP 124/70   Pulse 86   Temp 98.2 F (36.8 C)   Resp 16   Ht '5\' 7"'$  (1.702 m)   Wt 160 lb 9.6 oz (72.8 kg)   SpO2 98%   BMI 25.15 kg/m  Wt Readings from Last 3 Encounters:  12/13/22 160 lb 9.6 oz (72.8 kg)  10/11/22 159 lb 9.6 oz (72.4 kg)  04/09/22 150 lb 4.8 oz (68.2 kg)    Physical Exam Vitals reviewed.  Constitutional:      General: She is not in acute distress.    Appearance: Normal appearance.  HENT:     Head: Normocephalic and atraumatic.     Right Ear: External ear normal.     Left Ear: External ear normal.  Eyes:     General: No scleral icterus.       Right eye: No discharge.        Left eye: No discharge.     Conjunctiva/sclera: Conjunctivae normal.  Neck:     Thyroid: No thyromegaly.  Cardiovascular:     Rate and Rhythm: Normal rate and regular rhythm.  Pulmonary:     Effort: No respiratory distress.      Breath sounds: Normal breath sounds. No wheezing.  Abdominal:     General: Bowel sounds are normal.     Palpations: Abdomen is soft.     Tenderness: There is no abdominal tenderness.  Musculoskeletal:        General: No swelling or tenderness.     Cervical back: Neck supple. No tenderness.  Lymphadenopathy:     Cervical: No cervical adenopathy.  Skin:    Findings: No erythema or rash.  Neurological:     Mental Status: She is alert.  Psychiatric:        Mood and Affect: Mood normal.        Behavior: Behavior normal.      Outpatient Encounter Medications as of 12/13/2022  Medication Sig   Alpha-Lipoic Acid 600 MG TABS Take 600 mg by mouth daily.   cyanocobalamin (,VITAMIN B-12,) 1000 MCG/ML injection INJECT 1 ML (1000 MCG) INTO THE MUSCLE EVERY 30 DAYS, USE NEEDLES AND SYRINGES FOR ADMINISTRATION   estradiol (ESTRACE) 1 MG tablet TAKE 1 TABLET DAILY   levothyroxine (SYNTHROID) 50 MCG tablet TAKE 1 TABLET DAILY BEFORE BREAKFAST   pantoprazole (PROTONIX) 40 MG tablet TAKE 1 TABLET DAILY   SYRINGE-NEEDLE, DISP, 3 ML (B-D 3CC LUER-LOK SYR 25GX1") 25G X 1" 3 ML MISC USE 1 SYRINGE FOR EACH B12 ADMINISTRATION ONCE A MONTH   No facility-administered encounter medications on file as of 12/13/2022.     Lab Results  Component Value Date   WBC 6.3 10/11/2022   HGB 12.4 10/11/2022   HCT 35.8 (L) 10/11/2022   PLT 203.0 10/11/2022   GLUCOSE 83 10/11/2022   CHOL 210 (H) 04/08/2022   TRIG 109.0 04/08/2022   HDL 58.00 04/08/2022   LDLCALC 130 (H) 04/08/2022   ALT 13 10/11/2022   AST 13 10/11/2022   NA 140 10/11/2022   K 3.9 10/11/2022   CL 105 10/11/2022   CREATININE 0.91 10/11/2022   BUN 17 10/11/2022   CO2 30 10/11/2022   TSH 2.31 10/11/2022    MM 3D SCREEN BREAST BILATERAL  Result Date: 01/11/2020 CLINICAL DATA:  Screening. EXAM: DIGITAL SCREENING BILATERAL MAMMOGRAM WITH TOMO AND CAD COMPARISON:  None. ACR Breast Density Category d: The breast tissue is extremely dense,  which lowers the sensitivity of mammography. FINDINGS: There **Note De-Identified Teel Obfuscation** are no findings suspicious for malignancy. Images were processed with CAD. IMPRESSION: No mammographic evidence of malignancy. A result letter of this screening mammogram will be mailed directly to the patient. RECOMMENDATION: Screening mammogram in one year. (Code:SM-B-01Y) BI-RADS CATEGORY  1: Negative. Electronically Signed   By: Ammie Ferrier M.D.   On: 01/11/2020 13:31       Assessment & Plan:  Hypertriglyceridemia Assessment & Plan: Low carb diet and exercise.  Follow lipid panel.   Orders: -     Basic metabolic panel; Future -     Lipid panel; Future -     Hepatic function panel; Future  Abdominal bloating Assessment & Plan: Abdominal bloating with constipation.  Drinking hot tea.  Staying hydrated.  Miralax irritated.  Benefiber as directed.  Recent labs ok. KUB - moderate amount of retained stool.  Stay active. Appt with GI scheduled.  See if can get an earlier appt with GI to discuss further w/up and evaluation.     B12 deficiency Assessment & Plan: Receiving injections at home.  Follow B12 level.    Constipation, unspecified constipation type Assessment & Plan: Abdominal bloating with constipation.  Drinking hot tea.  Staying hydrated.  Miralax irritated.  Benefiber as directed.  Recent labs ok. KUB - moderate amount of retained stool.  Stay active. Appt with GI scheduled.  See if can get an earlier appt with GI to discuss further w/up and evaluation.     Hyperbilirubinemia Assessment & Plan: Recent liver panel wnl including bilirubin.    Other specified hypothyroidism Assessment & Plan: On thyroid replacement.  Follow tsh.    Right thyroid nodule Assessment & Plan:  She underwent a core biopsy of the nodule at [facility] on 11/11/2014. The pathology report was reviewed. 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  Recommended another f/u neck  ultrasound.  Planning to have in 04/2022. Need to confirm if had ultrasound.         Einar Pheasant, MD

## 2022-12-19 ENCOUNTER — Telehealth: Payer: Self-pay | Admitting: Internal Medicine

## 2022-12-19 DIAGNOSIS — L989 Disorder of the skin and subcutaneous tissue, unspecified: Secondary | ICD-10-CM

## 2022-12-19 NOTE — Telephone Encounter (Signed)
**Note De-Identified Kreitz Obfuscation** Please call and confirm with Kadeisha if she has a preference of which dermatologist she prefers to see.  Also, per note, was she scheduled to have f/u thyroid ultrasound in July - per review it appears had mentioned f/u ultrasound.  If had, need results.

## 2022-12-19 NOTE — Assessment & Plan Note (Signed)
**Note De-Identified Yarborough Obfuscation** Abdominal bloating with constipation.  Drinking hot tea.  Staying hydrated.  Miralax irritated.  Benefiber as directed.  Recent labs ok. KUB - moderate amount of retained stool.  Stay active. Appt with GI scheduled.  See if can get an earlier appt with GI to discuss further w/up and evaluation.

## 2022-12-19 NOTE — Assessment & Plan Note (Signed)
**Note De-Identified Wires Obfuscation** Receiving injections at home.  Follow B12 level.

## 2022-12-19 NOTE — Assessment & Plan Note (Signed)
**Note De-Identified Nayak Obfuscation** She underwent a core biopsy of the nodule at [facility] on 11/11/2014. The pathology report was reviewed. 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  Recommended another f/u neck ultrasound.  Planning to have in 04/2022. Need to confirm if had ultrasound.

## 2022-12-19 NOTE — Assessment & Plan Note (Signed)
**Note De-Identified Pheasant Obfuscation** Abdominal bloating with constipation.  Drinking hot tea.  Staying hydrated.  Miralax irritated.  Benefiber as directed.  Recent labs ok. KUB - moderate amount of retained stool.  Stay active. Appt with GI scheduled.  See if can get an earlier appt with GI to discuss further w/up and evaluation.

## 2022-12-19 NOTE — Assessment & Plan Note (Signed)
**Note De-identified Schliep Obfuscation** On thyroid replacement.  Follow tsh.  

## 2022-12-19 NOTE — Assessment & Plan Note (Signed)
**Note De-Identified Divis Obfuscation** Low carb diet and exercise.  Follow lipid panel.

## 2022-12-19 NOTE — Assessment & Plan Note (Signed)
**Note De-Identified Both Obfuscation** Recent liver panel wnl including bilirubin.

## 2022-12-20 NOTE — Telephone Encounter (Signed)
**Note De-identified Markiewicz Obfuscation** LM for pt to cb 

## 2022-12-22 NOTE — Telephone Encounter (Signed)
**Note De-Identified Seales Obfuscation** Ok to refer to [facility] - right hand lesion.

## 2022-12-22 NOTE — Telephone Encounter (Signed)
**Note De-Identified Pickerel Obfuscation** Ultrasound report requested from [facility]. She does not have a preference of Dermatologist. She says that we discussed one in [place] and would like to go there if possible. I can place referral- just need location and dx code.

## 2022-12-22 NOTE — Addendum Note (Signed)
**Note De-Identified Lape Obfuscation** Addended by: Roetta Sessions D on: 12/22/2022 03:42 PM   Modules accepted: Orders

## 2022-12-22 NOTE — Telephone Encounter (Signed)
**Note De-Identified Fosberg Obfuscation** Referred to [facility]

## 2022-12-29 ENCOUNTER — Ambulatory Visit
Admission: RE | Admit: 2022-12-29 | Discharge: 2022-12-29 | Disposition: A | Discharge status: Home / Self Care | Payer: BC Managed Care – PPO | Source: Ambulatory Visit | Attending: Internal Medicine | Admitting: Internal Medicine

## 2022-12-29 DIAGNOSIS — Z1231 Encounter for screening mammogram for malignant neoplasm of breast: Secondary | ICD-10-CM | POA: Diagnosis not present

## 2023-01-03 ENCOUNTER — Other Ambulatory Visit: Payer: Self-pay | Admitting: Internal Medicine

## 2023-01-03 DIAGNOSIS — N63 Unspecified lump in unspecified breast: Secondary | ICD-10-CM

## 2023-01-03 DIAGNOSIS — R928 Other abnormal and inconclusive findings on diagnostic imaging of breast: Secondary | ICD-10-CM

## 2023-01-03 NOTE — Progress Notes (Signed)
**Note De-Identified Langlais Obfuscation** Order placed for f/u right breast diagnostic mammogram and right breast ultrasound.

## 2023-01-07 ENCOUNTER — Ambulatory Visit
Admission: RE | Admit: 2023-01-07 | Discharge: 2023-01-07 | Disposition: A | Discharge status: Home / Self Care | Payer: BC Managed Care – PPO | Source: Ambulatory Visit | Attending: Internal Medicine | Admitting: Internal Medicine

## 2023-01-07 DIAGNOSIS — R928 Other abnormal and inconclusive findings on diagnostic imaging of breast: Secondary | ICD-10-CM

## 2023-01-07 DIAGNOSIS — N6011 Diffuse cystic mastopathy of right breast: Secondary | ICD-10-CM | POA: Diagnosis not present

## 2023-01-10 DIAGNOSIS — R140 Abdominal distension (gaseous): Secondary | ICD-10-CM | POA: Diagnosis not present

## 2023-01-10 DIAGNOSIS — K5909 Other constipation: Secondary | ICD-10-CM | POA: Diagnosis not present

## 2023-01-19 DIAGNOSIS — R051 Acute cough: Secondary | ICD-10-CM | POA: Diagnosis not present

## 2023-01-19 DIAGNOSIS — J309 Allergic rhinitis, unspecified: Secondary | ICD-10-CM | POA: Diagnosis not present

## 2023-01-19 DIAGNOSIS — Z6824 Body mass index (BMI) 24.0-24.9, adult: Secondary | ICD-10-CM | POA: Diagnosis not present

## 2023-02-22 ENCOUNTER — Encounter: Payer: Self-pay | Admitting: Internal Medicine

## 2023-02-22 NOTE — Telephone Encounter (Signed)
**Note De-Identified Goza Obfuscation** Pt called in to make an appt. Pt its booked for Thursday 5/2 with kaur @ 11am.

## 2023-02-23 DIAGNOSIS — S46812A Strain of other muscles, fascia and tendons at shoulder and upper arm level, left arm, initial encounter: Secondary | ICD-10-CM | POA: Diagnosis not present

## 2023-02-23 NOTE — Telephone Encounter (Signed)
**Note De-Identified Demicco Obfuscation** Called and spoke with patient for update. She fell the beginning of April and has had persistent pain in the back of her head since. Also was having dizziness and nausea intermittently. None today but still having the head pain. Patient said she thought she was just sleeping wrong but after repositioning, changing pillows etc- still no relief. Explained to patient that given her fall and persistent head pain- she needs to be evaluated more urgently to confirm nothing acute. Patient is going to go to urgent care and then will determine if ED visit is warranted based on their evaluation. Also explained to patient that my charts are not always seen same day so anytime she has any acute symptoms she should call so she can be triaged appropriately/more urgently. Pt gave verbal understanding.

## 2023-02-24 ENCOUNTER — Ambulatory Visit: Payer: BC Managed Care – PPO | Admitting: Nurse Practitioner

## 2023-04-07 ENCOUNTER — Other Ambulatory Visit (INDEPENDENT_AMBULATORY_CARE_PROVIDER_SITE_OTHER): Payer: BC Managed Care – PPO

## 2023-04-07 DIAGNOSIS — E781 Pure hyperglyceridemia: Secondary | ICD-10-CM | POA: Diagnosis not present

## 2023-04-07 LAB — BASIC METABOLIC PANEL
BUN: 16 mg/dL (ref 6–23)
CO2: 27 mEq/L (ref 19–32)
Calcium: 8.8 mg/dL (ref 8.4–10.5)
Chloride: 107 mEq/L (ref 96–112)
Creatinine, Ser: 0.9 mg/dL (ref 0.40–1.20)
GFR: 78.09 mL/min (ref >60.00)
Glucose, Bld: 93 mg/dL (ref 70–99)
Potassium: 3.7 mEq/L (ref 3.5–5.1)
Sodium: 141 mEq/L (ref 135–145)

## 2023-04-07 LAB — HEPATIC FUNCTION PANEL
ALT: 10 U/L (ref 0–35)
AST: 11 U/L (ref 0–37)
Albumin: 4.1 g/dL (ref 3.5–5.2)
Alkaline Phosphatase: 34 U/L — ABNORMAL LOW (ref 39–117)
Bilirubin, Direct: 0.1 mg/dL (ref 0.0–0.3)
Total Bilirubin: 0.6 mg/dL (ref 0.2–1.2)
Total Protein: 6.9 g/dL (ref 6.0–8.3)

## 2023-04-07 LAB — LIPID PANEL
Cholesterol: 202 mg/dL — ABNORMAL HIGH (ref 0–200)
HDL: 52.6 mg/dL (ref >39.00)
LDL Cholesterol: 137 mg/dL — ABNORMAL HIGH (ref 0–99)
NonHDL: 149
Total CHOL/HDL Ratio: 4
Triglycerides: 59 mg/dL (ref 0.0–149.0)
VLDL: 11.8 mg/dL (ref 0.0–40.0)

## 2023-04-12 ENCOUNTER — Encounter: Payer: BC Managed Care – PPO | Admitting: Internal Medicine

## 2023-04-12 NOTE — Telephone Encounter (Signed)
**Note De-Identified Noyes Obfuscation** Pt scheduled for July 2nd @ 4 pm

## 2023-04-26 ENCOUNTER — Ambulatory Visit: Payer: BC Managed Care – PPO | Admitting: Internal Medicine

## 2023-04-26 VITALS — BP 118/70 | HR 63 | Temp 97.9°F | Resp 16 | Ht 67.0 in | Wt 162.8 lb

## 2023-04-26 DIAGNOSIS — K5900 Constipation, unspecified: Secondary | ICD-10-CM | POA: Diagnosis not present

## 2023-04-26 DIAGNOSIS — E781 Pure hyperglyceridemia: Secondary | ICD-10-CM

## 2023-04-26 DIAGNOSIS — E538 Deficiency of other specified B group vitamins: Secondary | ICD-10-CM | POA: Diagnosis not present

## 2023-04-26 DIAGNOSIS — R140 Abdominal distension (gaseous): Secondary | ICD-10-CM | POA: Diagnosis not present

## 2023-04-26 DIAGNOSIS — E041 Nontoxic single thyroid nodule: Secondary | ICD-10-CM

## 2023-04-26 DIAGNOSIS — E2839 Other primary ovarian failure: Secondary | ICD-10-CM | POA: Diagnosis not present

## 2023-04-26 DIAGNOSIS — E038 Other specified hypothyroidism: Secondary | ICD-10-CM

## 2023-04-26 DIAGNOSIS — Z0000 Encounter for general adult medical examination without abnormal findings: Secondary | ICD-10-CM

## 2023-04-26 MED ORDER — CYANOCOBALAMIN 1000 MCG/ML IJ SOLN: INTRAMUSCULAR | 3 refills | Status: DC

## 2023-04-26 MED ORDER — "BD LUER-LOK SYRINGE 25G X 1"" 3 ML MISC": 3 refills | Status: DC

## 2023-04-26 MED ORDER — PANTOPRAZOLE SODIUM 40 MG PO TBEC: 40.0000 mg | DELAYED_RELEASE_TABLET | Freq: Every day | ORAL | 3 refills | Status: AC

## 2023-04-26 MED ORDER — ESTRADIOL 1 MG PO TABS: 1.0000 mg | ORAL_TABLET | Freq: Every day | ORAL | 3 refills | Status: DC

## 2023-04-26 NOTE — Progress Notes (Signed)
**Note De-Identified Burr Obfuscation** Subjective:    Patient ID: Noel Journey Maynes, female    DOB: 06/22/1981, 42 y.o.   MRN: [id number]  Patient here for  Chief Complaint  Patient presents with   Annual Exam    HPI Here for a physical exam. Has had issues with chronic constipation and abdominal bloating.  Saw GI 01/10/23.  Recommended trial of linzess.  Stays very active.  No chest pain or sob reported.  Does report - acid reflux.  No vomiting reported.  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  Hopefully less stress.  She did stop her thyroid medication in 02/2023.  Feels better off.  Discussed importance of taking thyroid medication (if needed).     Past Medical History:  Diagnosis Date   Bladder troubles    Migraine headache    Past Surgical History:  Procedure Laterality Date   CESAREAN SECTION     HERNIA REPAIR     HYSTEROSCOPY     TOTAL ABDOMINAL HYSTERECTOMY W/ BILATERAL SALPINGOOPHORECTOMY  04/2014   Family History  Problem Relation Age of Onset   Heart disease Brother    Hyperlipidemia Mother    Hyperlipidemia Father    Cancer Maternal Aunt        breast   Breast cancer Maternal Aunt        late 40's   Stroke Maternal Grandmother    Breast cancer Maternal Grandmother        great   Ovarian cancer Maternal Grandmother    Colon cancer Maternal Grandmother    Hyperlipidemia Maternal Grandfather    Cancer Paternal Grandmother        lung   Diabetes Paternal Grandmother    Diabetes Paternal Grandfather    Colon cancer Other        great grandmother   Social History   Socioeconomic History   Marital status: Married    Spouse name: Not on file   Number of children: 4   Years of education: Not on file   Highest education level: Bachelor's degree (e.g., BA, AB, BS)  Occupational History   Not on file  Tobacco Use   Smoking status: Never   Smokeless tobacco: Never  Substance and Sexual Activity   Alcohol use: Yes    Alcohol/week: 0.0 standard drinks of alcohol     Comment: rarely   Drug use: No   Sexual activity: Not on file  Other Topics Concern   Not on file  Social History Narrative   Not on file   Social Determinants of Health   Financial Resource Strain: Low Risk  (04/26/2023)   Overall Financial Resource Strain (CARDIA)    Difficulty of Paying Living Expenses: Not hard at all  Food Insecurity: No Food Insecurity (04/26/2023)   Hunger Vital Sign    Worried About Running Out of Food in the Last Year: Never true    Ran Out of Food in the Last Year: Never true  Transportation Needs: No Transportation Needs (04/26/2023)   PRAPARE - Administrator, Civil Service (Medical): No    Lack of Transportation (Non-Medical): No  Physical Activity: Insufficiently Active (04/26/2023)   Exercise Vital Sign    Days of Exercise per Week: 2 days    Minutes of Exercise per Session: 20 min  Stress: No Stress Concern Present (04/26/2023)   [employer] of Occupational Health - Occupational Stress Questionnaire    Feeling of Stress : Only a little  Social Connections: Socially **Note De-Identified Bettcher Obfuscation** Integrated (04/26/2023)   Social Connection and Isolation Panel [NHANES]    Frequency of Communication with Friends and Family: More than three times a week    Frequency of Social Gatherings with Friends and Family: Once a week    Attends Religious Services: More than 4 times per year    Active Member of [employer] or Organizations: Yes    Attends Engineer, structural: More than 4 times per year    Marital Status: Married     Review of Systems  Constitutional:  Negative for appetite change and unexpected weight change.  HENT:  Negative for congestion, sinus pressure and sore throat.   Eyes:  Negative for pain and visual disturbance.  Respiratory:  Negative for cough, chest tightness and shortness of breath.   Cardiovascular:  Negative for chest pain, palpitations and leg swelling.  Gastrointestinal:  Positive for constipation. Negative for diarrhea and vomiting.        Acid reflux.   Genitourinary:  Negative for difficulty urinating and dysuria.  Musculoskeletal:  Negative for joint swelling and myalgias.  Skin:  Negative for color change and rash.  Neurological:  Negative for dizziness and headaches.  Hematological:  Negative for adenopathy. Does not bruise/bleed easily.  Psychiatric/Behavioral:  Negative for agitation and dysphoric mood.        Objective:     BP 118/70   Pulse 63   Temp 97.9 F (36.6 C)   Resp 16   Ht 5\' 7"  (1.702 m)   Wt 162 lb 12.8 oz (73.8 kg)   SpO2 99%   BMI 25.50 kg/m  Wt Readings from Last 3 Encounters:  04/26/23 162 lb 12.8 oz (73.8 kg)  12/13/22 160 lb 9.6 oz (72.8 kg)  10/11/22 159 lb 9.6 oz (72.4 kg)    Physical Exam Vitals reviewed.  Constitutional:      General: She is not in acute distress.    Appearance: Normal appearance. She is well-developed.  HENT:     Head: Normocephalic and atraumatic.     Right Ear: External ear normal.     Left Ear: External ear normal.  Eyes:     General: No scleral icterus.       Right eye: No discharge.        Left eye: No discharge.     Conjunctiva/sclera: Conjunctivae normal.  Neck:     Thyroid: No thyromegaly.  Cardiovascular:     Rate and Rhythm: Normal rate and regular rhythm.  Pulmonary:     Effort: No tachypnea, accessory muscle usage or respiratory distress.     Breath sounds: Normal breath sounds. No decreased breath sounds or wheezing.  Chest:  Breasts:    Right: No inverted nipple, mass, nipple discharge or tenderness (no axillary adenopathy).     Left: No inverted nipple, mass, nipple discharge or tenderness (no axilarry adenopathy).  Abdominal:     General: Bowel sounds are normal.     Palpations: Abdomen is soft.     Tenderness: There is no abdominal tenderness.  Musculoskeletal:        General: No swelling or tenderness.     Cervical back: Neck supple.  Lymphadenopathy:     Cervical: No cervical adenopathy.  Skin:    Findings: No erythema or  rash.  Neurological:     Mental Status: She is alert and oriented to person, place, and time.  Psychiatric:        Mood and Affect: Mood normal. **Note De-Identified Kari Obfuscation** Behavior: Behavior normal.      Outpatient Encounter Medications as of 04/26/2023  Medication Sig   Alpha-Lipoic Acid 600 MG TABS Take 600 mg by mouth daily.   cyanocobalamin (VITAMIN B12) 1000 MCG/ML injection INJECT 1 ML (1000 MCG) INTO THE MUSCLE EVERY 30 DAYS, USE NEEDLES AND SYRINGES FOR ADMINISTRATION   estradiol (ESTRACE) 1 MG tablet Take 1 tablet (1 mg total) by mouth daily.   levothyroxine (SYNTHROID) 50 MCG tablet TAKE 1 TABLET DAILY BEFORE BREAKFAST   pantoprazole (PROTONIX) 40 MG tablet Take 1 tablet (40 mg total) by mouth daily.   SYRINGE-NEEDLE, DISP, 3 ML (B-D 3CC LUER-LOK SYR 25GX1") 25G X 1" 3 ML MISC USE 1 SYRINGE FOR EACH B12 ADMINISTRATION ONCE A MONTH   [DISCONTINUED] cyanocobalamin (,VITAMIN B-12,) 1000 MCG/ML injection INJECT 1 ML (1000 MCG) INTO THE MUSCLE EVERY 30 DAYS, USE NEEDLES AND SYRINGES FOR ADMINISTRATION   [DISCONTINUED] estradiol (ESTRACE) 1 MG tablet TAKE 1 TABLET DAILY   [DISCONTINUED] pantoprazole (PROTONIX) 40 MG tablet TAKE 1 TABLET DAILY   [DISCONTINUED] SYRINGE-NEEDLE, DISP, 3 ML (B-D 3CC LUER-LOK SYR 25GX1") 25G X 1" 3 ML MISC USE 1 SYRINGE FOR EACH B12 ADMINISTRATION ONCE A MONTH   No facility-administered encounter medications on file as of 04/26/2023.     Lab Results  Component Value Date   WBC 6.3 10/11/2022   HGB 12.4 10/11/2022   HCT 35.8 (L) 10/11/2022   PLT 203.0 10/11/2022   GLUCOSE 93 04/07/2023   CHOL 202 (H) 04/07/2023   TRIG 59.0 04/07/2023   HDL 52.60 04/07/2023   LDLCALC 137 (H) 04/07/2023   ALT 10 04/07/2023   AST 11 04/07/2023   NA 141 04/07/2023   K 3.7 04/07/2023   CL 107 04/07/2023   CREATININE 0.90 04/07/2023   BUN 16 04/07/2023   CO2 27 04/07/2023   TSH 2.31 10/11/2022    MM 3D DIAGNOSTIC MAMMOGRAM UNILATERAL RIGHT BREAST  Result Date: 01/07/2023 CLINICAL  DATA:  Screening recall for a possible mass in the right breast. EXAM: DIGITAL DIAGNOSTIC UNILATERAL RIGHT MAMMOGRAM WITH TOMOSYNTHESIS; ULTRASOUND RIGHT BREAST LIMITED TECHNIQUE: Right digital diagnostic mammography and breast tomosynthesis was performed.; Targeted ultrasound examination of the right breast was performed COMPARISON:  Previous exam(s). ACR Breast Density Category d: The breasts are extremely dense, which lowers the sensitivity of mammography. FINDINGS: On the diagnostic spot-compression images, the possible mass noted along the posterior aspect of the right breast fibroglandular tissue on the current screening exam disperses consistent with superimposed fibroglandular tissue. There is no underlying mass or significant residual asymmetry. There no areas of architectural distortion. Targeted ultrasound is performed, showing scattered small cysts in the anterior to middle depth of the right breast, largest 7 mm in size. No solid masses or suspicious lesions. No abnormality noted in the more posterior breast in the location of the original screening mammographic finding. IMPRESSION: 1. No evidence of breast malignancy. 2. Small benign right breast cysts. RECOMMENDATION: Screening mammogram in one year.(Code:SM-B-01Y) I have discussed the findings and recommendations with the patient. If applicable, a reminder letter will be sent to the patient regarding the next appointment. BI-RADS CATEGORY  2: Benign. Electronically Signed   By: Amie Portland M.D.   On: 01/07/2023 15:01  [place] LIMITED ULTRASOUND INCLUDING AXILLA RIGHT BREAST  Result Date: 01/07/2023 CLINICAL DATA:  Screening recall for a possible mass in the right breast. EXAM: DIGITAL DIAGNOSTIC UNILATERAL RIGHT MAMMOGRAM WITH TOMOSYNTHESIS; ULTRASOUND RIGHT BREAST LIMITED TECHNIQUE: Right digital diagnostic mammography and breast tomosynthesis was performed.; **Note De-Identified Beauchamp Obfuscation** Targeted ultrasound examination of the right breast was performed COMPARISON:  Previous  exam(s). ACR Breast Density Category d: The breasts are extremely dense, which lowers the sensitivity of mammography. FINDINGS: On the diagnostic spot-compression images, the possible mass noted along the posterior aspect of the right breast fibroglandular tissue on the current screening exam disperses consistent with superimposed fibroglandular tissue. There is no underlying mass or significant residual asymmetry. There no areas of architectural distortion. Targeted ultrasound is performed, showing scattered small cysts in the anterior to middle depth of the right breast, largest 7 mm in size. No solid masses or suspicious lesions. No abnormality noted in the more posterior breast in the location of the original screening mammographic finding. IMPRESSION: 1. No evidence of breast malignancy. 2. Small benign right breast cysts. RECOMMENDATION: Screening mammogram in one year.(Code:SM-B-01Y) I have discussed the findings and recommendations with the patient. If applicable, a reminder letter will be sent to the patient regarding the next appointment. BI-RADS CATEGORY  2: Benign. Electronically Signed   By: Amie Portland M.D.   On: 01/07/2023 15:01      Assessment & Plan:  Abdominal bloating Assessment & Plan: Abdominal bloating with constipation.saw GI.  Placed on linzess.  Follow.    B12 deficiency Assessment & Plan: Continues on B12 injections.    Constipation, unspecified constipation type Assessment & Plan: Chronic problem for her.  Stays hydrated.  Saw GI.  Trial of Linzess.    Estrogen deficiency Assessment & Plan: S/p TAH/BSO.  Started on estrogen by gyn.     Hypertriglyceridemia Assessment & Plan: Low carb diet and exercise.  Follow lipid panel.    Health care maintenance Assessment & Plan: Physical today 04/26/23.  Mammogram - 01/01/23 - Birads 0.  F/u right breast mammogram - Birads II.  Continue yearly mammogram screening.  S/p hysterectomy.    Other specified  hypothyroidism Assessment & Plan: She stopped her thyroid medication.  Recheck tsh.  Discussed importance of taking.    Right thyroid nodule Assessment & Plan: She underwent a core biopsy of the nodule at [facility] on 11/11/2014. The pathology report was reviewed. 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  Recommended another f/u neck ultrasound.  Ultrasound 05/04/22 - Homogeneous thyroid gland which is not enlarged. Thyroid appearance is unremarkable other than a solitary sub-centimeter right-sided nodule. A follow-up ultrasound for surveillance is not recommended.     Other orders -     Cyanocobalamin; INJECT 1 ML (1000 MCG) INTO THE MUSCLE EVERY 30 DAYS, USE NEEDLES AND SYRINGES FOR ADMINISTRATION  Dispense: 10 mL; Refill: 3 -     Estradiol; Take 1 tablet (1 mg total) by mouth daily.  Dispense: 90 tablet; Refill: 3 -     Pantoprazole Sodium; Take 1 tablet (40 mg total) by mouth daily.  Dispense: 90 tablet; Refill: 3 -     BD Luer-Lok Syringe; USE 1 SYRINGE FOR EACH B12 ADMINISTRATION ONCE A MONTH  Dispense: 50 each; Refill: 3     Dale [place], MD

## 2023-05-01 ENCOUNTER — Encounter: Payer: Self-pay | Admitting: Internal Medicine

## 2023-05-01 ENCOUNTER — Telehealth: Payer: Self-pay | Admitting: Internal Medicine

## 2023-05-01 DIAGNOSIS — E038 Other specified hypothyroidism: Secondary | ICD-10-CM

## 2023-05-01 NOTE — Telephone Encounter (Signed)
**Note De-Identified Raap Obfuscation** Given that she stopped her thyroid medication,  she needs a f/u tsh checked.  Order is in for lab.  Please schedule tsh in 12 weeks.

## 2023-05-01 NOTE — Assessment & Plan Note (Signed)
**Note De-identified Vossler Obfuscation** Continues on B12 injections 

## 2023-05-01 NOTE — Assessment & Plan Note (Signed)
**Note De-identified Simar Obfuscation** S/p TAH/BSO.  Started on estrogen by gyn.   

## 2023-05-01 NOTE — Assessment & Plan Note (Signed)
**Note De-Identified Tripodi Obfuscation** Physical today 04/26/23.  Mammogram - 01/01/23 - Birads 0.  F/u right breast mammogram - Birads II.  Continue yearly mammogram screening.  S/p hysterectomy.

## 2023-05-01 NOTE — Assessment & Plan Note (Signed)
**Note De-Identified Murtagh Obfuscation** Chronic problem for her.  Stays hydrated.  Saw GI.  Trial of Linzess.

## 2023-05-01 NOTE — Assessment & Plan Note (Signed)
**Note De-Identified Uliano Obfuscation** Abdominal bloating with constipation.saw GI.  Placed on linzess.  Follow.

## 2023-05-01 NOTE — Assessment & Plan Note (Signed)
**Note De-Identified Lippert Obfuscation** She underwent a core biopsy of the nodule at [facility] on 11/11/2014. The pathology report was reviewed. 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  Recommended another f/u neck ultrasound.  Ultrasound 05/04/22 - Homogeneous thyroid gland which is not enlarged. Thyroid appearance is unremarkable other than a solitary sub-centimeter right-sided nodule. A follow-up ultrasound for surveillance is not recommended.

## 2023-05-01 NOTE — Assessment & Plan Note (Signed)
**Note De-Identified Schaab Obfuscation** She stopped her thyroid medication.  Recheck tsh.  Discussed importance of taking.

## 2023-05-01 NOTE — Assessment & Plan Note (Signed)
**Note De-identified Mutchler Obfuscation** Low carb diet and exercise.  Follow lipid panel.  

## 2023-05-02 NOTE — Telephone Encounter (Signed)
**Note De-identified Laseter Obfuscation** Pt scheduled  

## 2023-05-09 ENCOUNTER — Other Ambulatory Visit: Payer: Self-pay | Admitting: Internal Medicine

## 2023-05-11 NOTE — Telephone Encounter (Signed)
**Note De-Identified Meeker Obfuscation** Please refuse. She has stopped her thyroid meds and per last note- scheduled for f/u tsh in 12 weeks

## 2023-07-21 ENCOUNTER — Other Ambulatory Visit: Payer: Self-pay | Admitting: Internal Medicine

## 2023-07-26 ENCOUNTER — Other Ambulatory Visit: Payer: BC Managed Care – PPO

## 2023-07-29 ENCOUNTER — Other Ambulatory Visit: Payer: BC Managed Care – PPO

## 2023-07-29 NOTE — Addendum Note (Signed)
**Note De-Identified Mogan Obfuscation** Addended by: Jarvis Morgan D on: 07/29/2023 01:01 PM   Modules accepted: Orders

## 2023-08-01 ENCOUNTER — Other Ambulatory Visit: Payer: BC Managed Care – PPO

## 2023-08-03 ENCOUNTER — Other Ambulatory Visit (INDEPENDENT_AMBULATORY_CARE_PROVIDER_SITE_OTHER): Payer: BC Managed Care – PPO

## 2023-08-03 DIAGNOSIS — E038 Other specified hypothyroidism: Secondary | ICD-10-CM

## 2023-08-03 NOTE — Addendum Note (Signed)
**Note De-Identified Leven Obfuscation** Addended by: Warden Fillers on: 08/03/2023 03:52 PM   Modules accepted: Orders

## 2023-08-04 LAB — TSH: TSH: 5.11 u[IU]/mL (ref 0.35–5.50)

## 2023-08-05 ENCOUNTER — Other Ambulatory Visit: Payer: Self-pay

## 2023-08-05 DIAGNOSIS — R7989 Other specified abnormal findings of blood chemistry: Secondary | ICD-10-CM

## 2023-09-18 ENCOUNTER — Encounter: Payer: Self-pay | Admitting: Internal Medicine

## 2023-09-19 ENCOUNTER — Other Ambulatory Visit: Payer: Self-pay

## 2023-10-03 ENCOUNTER — Other Ambulatory Visit (INDEPENDENT_AMBULATORY_CARE_PROVIDER_SITE_OTHER): Payer: BC Managed Care – PPO

## 2023-10-03 DIAGNOSIS — R7989 Other specified abnormal findings of blood chemistry: Secondary | ICD-10-CM

## 2023-10-03 DIAGNOSIS — R946 Abnormal results of thyroid function studies: Secondary | ICD-10-CM

## 2023-10-04 LAB — TSH: TSH: 5.55 u[IU]/mL — ABNORMAL HIGH (ref 0.35–5.50)

## 2023-10-07 ENCOUNTER — Other Ambulatory Visit: Payer: Self-pay | Admitting: Internal Medicine

## 2023-10-07 DIAGNOSIS — E038 Other specified hypothyroidism: Secondary | ICD-10-CM

## 2023-10-07 MED ORDER — LEVOTHYROXINE SODIUM 25 MCG PO TABS: 25.0000 ug | ORAL_TABLET | Freq: Every day | ORAL | 3 refills | Status: DC

## 2023-10-07 NOTE — Progress Notes (Signed)
**Note De-Identified Colina Obfuscation** Rx sent in for synthroid q day. TSH ordered.

## 2023-10-08 ENCOUNTER — Other Ambulatory Visit: Payer: Self-pay | Admitting: Internal Medicine

## 2023-10-08 MED ORDER — LEVOTHYROXINE SODIUM 50 MCG PO TABS: 50.0000 ug | ORAL_TABLET | Freq: Every day | ORAL | 1 refills | Status: DC

## 2023-10-08 NOTE — Progress Notes (Signed)
**Note De-Identified Klump Obfuscation** [facility] sent in for [facility].

## 2023-10-10 ENCOUNTER — Other Ambulatory Visit: Payer: Self-pay

## 2023-10-10 MED ORDER — "BD LUER-LOK SYRINGE 25G X 1"" 3 ML MISC": 3 refills | Status: DC

## 2023-10-26 NOTE — Progress Notes (Signed)
**Note De-Identified [place] Obfuscation** Subjective:    Patient ID: Sandy Jefferson, female    DOB: 1981-10-08, 43 y.o.   MRN: [id number]  Patient here for  Chief Complaint  Patient presents with   Medical Management of Chronic Issues    HPI Here for a scheduled follow up.  Follow up regarding previous abdominal bloating and constipation. Saw GI 01/10/23. Recommended trial of linzess. Takes prn and keeps her bowels moving with this regimen. Recently stopped her thyroid  medication.  Recent TSH slightly elevated.  Restarted synthroid  50mcg q day. Stays active. Breathing stable. Discussed weight gain.    Past Medical History:  Diagnosis Date   Bladder troubles    Migraine headache    Past Surgical History:  Procedure Laterality Date   CESAREAN SECTION     HERNIA REPAIR     HYSTEROSCOPY     TOTAL ABDOMINAL HYSTERECTOMY W/ BILATERAL SALPINGOOPHORECTOMY  04/2014   Family History  Problem Relation Age of Onset   Heart disease Brother    Hyperlipidemia Mother    Hyperlipidemia Father    Cancer Maternal Aunt        breast   Breast cancer Maternal Aunt        late 40's   Stroke Maternal Grandmother    Breast cancer Maternal Grandmother        great   Ovarian cancer Maternal Grandmother    Colon cancer Maternal Grandmother    Hyperlipidemia Maternal Grandfather    Cancer Paternal Grandmother        lung   Diabetes Paternal Grandmother    Diabetes Paternal Grandfather    Colon cancer Other        great grandmother   Social History   Socioeconomic History   Marital status: Married    Spouse name: Not on file   Number of children: 4   Years of education: Not on file   Highest education level: Bachelor's degree (e.g., BA, AB, BS)  Occupational History   Not on file  Tobacco Use   Smoking status: Never   Smokeless tobacco: Never  Substance and Sexual Activity   Alcohol use: Yes    Alcohol/week: 0.0 standard drinks of alcohol    Comment: rarely   Drug use: No   Sexual activity: Not on file  Other Topics  Concern   Not on file  Social History Narrative   Not on file   Social Drivers of Health   Financial Resource Strain: Low Risk  (10/27/2023)   Overall Financial Resource Strain (CARDIA)    Difficulty of Paying Living Expenses: Not hard at all  Food Insecurity: No Food Insecurity (10/27/2023)   Hunger Vital Sign    Worried About Running Out of Food in the Last Year: Never true    Ran Out of Food in the Last Year: Never true  Transportation Needs: No Transportation Needs (10/27/2023)   PRAPARE - Administrator, Civil Service (Medical): No    Lack of Transportation (Non-Medical): No  Physical Activity: Insufficiently Active (10/27/2023)   Exercise Vital Sign    Days of Exercise per Week: 2 days    Minutes of Exercise per Session: 10 min  Stress: No Stress Concern Present (10/27/2023)   [employer] of Occupational Health - Occupational Stress Questionnaire    Feeling of Stress : Only a little  Social Connections: Socially Integrated (10/27/2023)   Social Connection and Isolation Panel [NHANES]    Frequency of Communication with Friends and Family: More than three times a week **Note De-Identified [place] Obfuscation** Frequency of Social Gatherings with Friends and Family: More than three times a week    Attends Religious Services: More than 4 times per year    Active Member of Clubs or Organizations: Yes    Attends Engineer, Structural: More than 4 times per year    Marital Status: Married     Review of Systems  Constitutional:  Negative for appetite change and unexpected weight change.  HENT:  Negative for congestion and sinus pressure.   Respiratory:  Negative for cough, chest tightness and shortness of breath.   Cardiovascular:  Negative for chest pain and palpitations.  Gastrointestinal:  Negative for abdominal pain, diarrhea, nausea and vomiting.  Genitourinary:  Negative for difficulty urinating and dysuria.  Musculoskeletal:  Negative for joint swelling and myalgias.  Skin:  Negative for color  change and rash.  Neurological:  Negative for dizziness and headaches.  Psychiatric/Behavioral:  Negative for agitation and dysphoric mood.        Objective:     BP 110/70   Pulse 78   Temp 98.2 F (36.8 C)   Resp 16   Ht 5' 7 (1.702 m)   Wt 165 lb 3.2 oz (74.9 kg)   SpO2 98%   BMI 25.87 kg/m  Wt Readings from Last 3 Encounters:  10/27/23 165 lb 3.2 oz (74.9 kg)  04/26/23 162 lb 12.8 oz (73.8 kg)  12/13/22 160 lb 9.6 oz (72.8 kg)    Physical Exam Vitals reviewed.  Constitutional:      General: She is not in acute distress.    Appearance: Normal appearance.  HENT:     Head: Normocephalic and atraumatic.     Right Ear: External ear normal.     Left Ear: External ear normal.     Mouth/Throat:     Pharynx: No oropharyngeal exudate or posterior oropharyngeal erythema.  Eyes:     General: No scleral icterus.       Right eye: No discharge.        Left eye: No discharge.     Conjunctiva/sclera: Conjunctivae normal.  Neck:     Thyroid : No thyromegaly.  Cardiovascular:     Rate and Rhythm: Normal rate and regular rhythm.  Pulmonary:     Effort: No respiratory distress.     Breath sounds: Normal breath sounds. No wheezing.  Abdominal:     General: Bowel sounds are normal.     Palpations: Abdomen is soft.     Tenderness: There is no abdominal tenderness.  Musculoskeletal:        General: No swelling or tenderness.     Cervical back: Neck supple. No tenderness.  Lymphadenopathy:     Cervical: No cervical adenopathy.  Skin:    Findings: No erythema or rash.  Neurological:     Mental Status: She is alert.  Psychiatric:        Mood and Affect: Mood normal.        Behavior: Behavior normal.      Outpatient Encounter Medications as of 10/27/2023  Medication Sig   Alpha-Lipoic Acid 600 MG TABS Take 600 mg by mouth daily.   cyanocobalamin  (VITAMIN B12) 1000 MCG/ML injection INJECT 1 ML (1000 MCG) INTO THE MUSCLE EVERY 30 DAYS, USE NEEDLES AND SYRINGES FOR  ADMINISTRATION   estradiol  (ESTRACE ) 1 MG tablet Take 1 tablet (1 mg total) by mouth daily.   levothyroxine  (SYNTHROID ) 50 MCG tablet Take 1 tablet (50 mcg total) by mouth daily.   pantoprazole  (PROTONIX ) 40 MG **Note De-Identified [place] Obfuscation** tablet Take 1 tablet (40 mg total) by mouth daily.   SYRINGE-NEEDLE, DISP, 3 ML (B-D 3CC LUER-LOK SYR 25GX1) 25G X 1 3 ML MISC USE 1 SYRINGE FOR EACH B12 ADMINISTRATION ONCE A MONTH   No facility-administered encounter medications on file as of 10/27/2023.     Lab Results  Component Value Date   WBC 6.3 10/11/2022   HGB 12.4 10/11/2022   HCT 35.8 (L) 10/11/2022   PLT 203.0 10/11/2022   GLUCOSE 93 04/07/2023   CHOL 202 (H) 04/07/2023   TRIG 59.0 04/07/2023   HDL 52.60 04/07/2023   LDLCALC 137 (H) 04/07/2023   ALT 10 04/07/2023   AST 11 04/07/2023   NA 141 04/07/2023   K 3.7 04/07/2023   CL 107 04/07/2023   CREATININE 0.90 04/07/2023   BUN 16 04/07/2023   CO2 27 04/07/2023   TSH 5.55 (H) 10/03/2023    MM 3D DIAGNOSTIC MAMMOGRAM UNILATERAL RIGHT BREAST Result Date: 01/07/2023 CLINICAL DATA:  Screening recall for a possible mass in the right breast. EXAM: DIGITAL DIAGNOSTIC UNILATERAL RIGHT MAMMOGRAM WITH TOMOSYNTHESIS; ULTRASOUND RIGHT BREAST LIMITED TECHNIQUE: Right digital diagnostic mammography and breast tomosynthesis was performed.; Targeted ultrasound examination of the right breast was performed COMPARISON:  Previous exam(s). ACR Breast Density Category d: The breasts are extremely dense, which lowers the sensitivity of mammography. FINDINGS: On the diagnostic spot-compression images, the possible mass noted along the posterior aspect of the right breast fibroglandular tissue on the current screening exam disperses consistent with superimposed fibroglandular tissue. There is no underlying mass or significant residual asymmetry. There no areas of architectural distortion. Targeted ultrasound is performed, showing scattered small cysts in the anterior to middle depth of the  right breast, largest 7 mm in size. No solid masses or suspicious lesions. No abnormality noted in the more posterior breast in the location of the original screening mammographic finding. IMPRESSION: 1. No evidence of breast malignancy. 2. Small benign right breast cysts. RECOMMENDATION: Screening mammogram in one year.(Code:SM-B-01Y) I have discussed the findings and recommendations with the patient. If applicable, a reminder letter will be sent to the patient regarding the next appointment. BI-RADS CATEGORY  2: Benign. Electronically Signed   By: Alm Parkins M.D.   On: 01/07/2023 15:01  US  LIMITED ULTRASOUND INCLUDING AXILLA RIGHT BREAST Result Date: 01/07/2023 CLINICAL DATA:  Screening recall for a possible mass in the right breast. EXAM: DIGITAL DIAGNOSTIC UNILATERAL RIGHT MAMMOGRAM WITH TOMOSYNTHESIS; ULTRASOUND RIGHT BREAST LIMITED TECHNIQUE: Right digital diagnostic mammography and breast tomosynthesis was performed.; Targeted ultrasound examination of the right breast was performed COMPARISON:  Previous exam(s). ACR Breast Density Category d: The breasts are extremely dense, which lowers the sensitivity of mammography. FINDINGS: On the diagnostic spot-compression images, the possible mass noted along the posterior aspect of the right breast fibroglandular tissue on the current screening exam disperses consistent with superimposed fibroglandular tissue. There is no underlying mass or significant residual asymmetry. There no areas of architectural distortion. Targeted ultrasound is performed, showing scattered small cysts in the anterior to middle depth of the right breast, largest 7 mm in size. No solid masses or suspicious lesions. No abnormality noted in the more posterior breast in the location of the original screening mammographic finding. IMPRESSION: 1. No evidence of breast malignancy. 2. Small benign right breast cysts. RECOMMENDATION: Screening mammogram in one year.(Code:SM-B-01Y) I have  discussed the findings and recommendations with the patient. If applicable, a reminder letter will be sent to the patient regarding the next appointment. BI-RADS CATEGORY **Note De-Identified [place] Obfuscation** 2: Benign. Electronically Signed   By: Alm Parkins M.D.   On: 01/07/2023 15:01      Assessment & Plan:  Abdominal bloating Assessment & Plan: Abdominal bloating with constipation.saw GI.  Placed on linzess.  Takes prn. This regimen helps to keep her bowels moving.  Follow.    Constipation, unspecified constipation type Assessment & Plan: Chronic problem for her.  Stays hydrated.  Saw GI. On Linzess.    Hypertriglyceridemia Assessment & Plan: Low carb diet and exercise.  Follow lipid panel.    Other specified hypothyroidism Assessment & Plan: She stopped her thyroid  medication.  Back on synthroid .  Follow tsh.    Weight gain Assessment & Plan: She is concerned regarding weight gain. Discussed diet and exercise. Back on synthroid . Follow tsh.       Allena Hamilton, MD

## 2023-10-27 ENCOUNTER — Ambulatory Visit: Payer: BC Managed Care – PPO | Admitting: Internal Medicine

## 2023-10-27 VITALS — BP 110/70 | HR 78 | Temp 98.2°F | Resp 16 | Ht 67.0 in | Wt 165.2 lb

## 2023-10-27 DIAGNOSIS — E781 Pure hyperglyceridemia: Secondary | ICD-10-CM

## 2023-10-27 DIAGNOSIS — K5900 Constipation, unspecified: Secondary | ICD-10-CM | POA: Diagnosis not present

## 2023-10-27 DIAGNOSIS — E038 Other specified hypothyroidism: Secondary | ICD-10-CM | POA: Diagnosis not present

## 2023-10-27 DIAGNOSIS — R140 Abdominal distension (gaseous): Secondary | ICD-10-CM | POA: Diagnosis not present

## 2023-10-27 DIAGNOSIS — R635 Abnormal weight gain: Secondary | ICD-10-CM

## 2023-10-30 ENCOUNTER — Encounter: Payer: Self-pay | Admitting: Internal Medicine

## 2023-10-30 DIAGNOSIS — R635 Abnormal weight gain: Secondary | ICD-10-CM | POA: Insufficient documentation

## 2023-10-30 NOTE — Assessment & Plan Note (Signed)
**Note De-Identified [place] Obfuscation** She stopped her thyroid medication.  Back on synthroid.  Follow tsh.

## 2023-10-30 NOTE — Assessment & Plan Note (Signed)
**Note De-Identified [place] Obfuscation** Chronic problem for her.  Stays hydrated.  Saw GI. On Linzess.

## 2023-10-30 NOTE — Assessment & Plan Note (Signed)
**Note De-Identified [place] Obfuscation** Abdominal bloating with constipation.saw GI.  Placed on linzess.  Takes prn. This regimen helps to keep her bowels moving.  Follow.

## 2023-10-30 NOTE — Assessment & Plan Note (Signed)
**Note De-Identified [place] Obfuscation** She is concerned regarding weight gain. Discussed diet and exercise. Back on synthroid. Follow tsh.

## 2023-10-30 NOTE — Assessment & Plan Note (Signed)
Low carb diet and exercise.  Follow lipid panel.  

## 2023-11-30 ENCOUNTER — Other Ambulatory Visit (INDEPENDENT_AMBULATORY_CARE_PROVIDER_SITE_OTHER): Payer: BC Managed Care – PPO

## 2023-11-30 DIAGNOSIS — E038 Other specified hypothyroidism: Secondary | ICD-10-CM

## 2023-12-01 LAB — TSH: TSH: 0.4 u[IU]/mL (ref 0.35–5.50)

## 2023-12-02 ENCOUNTER — Other Ambulatory Visit: Payer: Self-pay

## 2023-12-02 DIAGNOSIS — R7989 Other specified abnormal findings of blood chemistry: Secondary | ICD-10-CM

## 2024-01-03 ENCOUNTER — Other Ambulatory Visit: Payer: Self-pay | Admitting: Internal Medicine

## 2024-01-05 ENCOUNTER — Ambulatory Visit: Payer: BC Managed Care – PPO | Admitting: Dermatology

## 2024-01-17 ENCOUNTER — Other Ambulatory Visit: Payer: BC Managed Care – PPO

## 2024-01-19 ENCOUNTER — Ambulatory Visit: Payer: BC Managed Care – PPO | Admitting: Dermatology

## 2024-01-23 ENCOUNTER — Other Ambulatory Visit (INDEPENDENT_AMBULATORY_CARE_PROVIDER_SITE_OTHER)

## 2024-01-23 ENCOUNTER — Encounter: Payer: Self-pay | Admitting: Dermatology

## 2024-01-23 ENCOUNTER — Ambulatory Visit: Admitting: Dermatology

## 2024-01-23 DIAGNOSIS — L821 Other seborrheic keratosis: Secondary | ICD-10-CM

## 2024-01-23 DIAGNOSIS — L570 Actinic keratosis: Secondary | ICD-10-CM | POA: Diagnosis not present

## 2024-01-23 DIAGNOSIS — R946 Abnormal results of thyroid function studies: Secondary | ICD-10-CM

## 2024-01-23 DIAGNOSIS — L578 Other skin changes due to chronic exposure to nonionizing radiation: Secondary | ICD-10-CM

## 2024-01-23 DIAGNOSIS — B079 Viral wart, unspecified: Secondary | ICD-10-CM

## 2024-01-23 DIAGNOSIS — W908XXA Exposure to other nonionizing radiation, initial encounter: Secondary | ICD-10-CM | POA: Diagnosis not present

## 2024-01-23 DIAGNOSIS — L814 Other melanin hyperpigmentation: Secondary | ICD-10-CM | POA: Diagnosis not present

## 2024-01-23 DIAGNOSIS — R7989 Other specified abnormal findings of blood chemistry: Secondary | ICD-10-CM | POA: Diagnosis not present

## 2024-01-23 DIAGNOSIS — Z7189 Other specified counseling: Secondary | ICD-10-CM

## 2024-01-23 NOTE — Patient Instructions (Addendum)
**Note De-Identified [place] Obfuscation** Recommend Eucerin with urea cream to use at hands   Recommend starting moisturizer with exfoliant (Urea, Salicylic acid, or Lactic acid) one to two times daily to help smooth rough and bumpy skin.  OTC options include Cetaphil Rough and Bumpy lotion (Urea), Eucerin Roughness Relief lotion or spot treatment cream (Urea), CeraVe SA lotion/cream for Rough and Bumpy skin (Sal Acid), Gold Bond Rough and Bumpy cream (Sal Acid), and AmLactin 12% lotion/cream (Lactic Acid).  If applying in morning, also apply sunscreen to sun-exposed areas, since these exfoliating moisturizers can increase sensitivity to sun.   Cryotherapy Aftercare  Wash gently with soap and water everyday.   Apply Vaseline and Band-Aid daily until healed.    Actinic keratoses are precancerous spots that appear secondary to cumulative UV radiation exposure/sun exposure over time. They are chronic with expected duration over 1 year. A portion of actinic keratoses will progress to squamous cell carcinoma of the skin. It is not possible to reliably predict which spots will progress to skin cancer and so treatment is recommended to prevent development of skin cancer.  Recommend daily broad spectrum sunscreen SPF 30+ to sun-exposed areas, reapply every 2 hours as needed.  Recommend staying in the shade or wearing long sleeves, sun glasses (UVA+UVB protection) and wide brim hats (4-inch brim around the entire circumference of the hat). Call for new or changing lesions.     Seborrheic Keratosis  What causes seborrheic keratoses? Seborrheic keratoses are harmless, common skin growths that first appear during adult life.  As time goes by, more growths appear.  Some people may develop a large number of them.  Seborrheic keratoses appear on both covered and uncovered body parts.  They are not caused by sunlight.  The tendency to develop seborrheic keratoses can be inherited.  They vary in color from skin-colored to gray, brown, or even black.   They can be either smooth or have a rough, warty surface.   Seborrheic keratoses are superficial and look as if they were stuck on the skin.  Under the microscope this type of keratosis looks like layers upon layers of skin.  That is why at times the top layer may seem to fall off, but the rest of the growth remains and re-grows.    Treatment Seborrheic keratoses do not need to be treated, but can easily be removed in the office.  Seborrheic keratoses often cause symptoms when they rub on clothing or jewelry.  Lesions can be in the way of shaving.  If they become inflamed, they can cause itching, soreness, or burning.  Removal of a seborrheic keratosis can be accomplished by freezing, burning, or surgery. If any spot bleeds, scabs, or grows rapidly, please return to have it checked, as these can be an indication of a skin cancer.      Due to recent changes in healthcare laws, you may see results of your pathology and/or laboratory studies on MyChart before the doctors have had a chance to review them. We understand that in some cases there may be results that are confusing or concerning to you. Please understand that not all results are received at the same time and often the doctors may need to interpret multiple results in order to provide you with the best plan of care or course of treatment. Therefore, we ask that you please give [place] 2 business days to thoroughly review all your results before contacting the office for clarification. Should we see a critical lab result, you will be contacted sooner. **Note De-Identified [place] Obfuscation** If You Need Anything After Your Visit  If you have any questions or concerns for your doctor, please call our main line at [phone] and press option 4 to reach your doctor's medical assistant. If no one answers, please leave a voicemail as directed and we will return your call as soon as possible. Messages left after 4 pm will be answered the following business day.   You may also send [place] a  message [place] MyChart. We typically respond to MyChart messages within 1-2 business days.  For prescription refills, please ask your pharmacy to contact our office. Our fax number is [phone].  If you have an urgent issue when the clinic is closed that cannot wait until the next business day, you can page your doctor at the number below.    Please note that while we do our best to be available for urgent issues outside of office hours, we are not available 24/7.   If you have an urgent issue and are unable to reach [place], you may choose to seek medical care at your doctor's office, retail clinic, urgent care center, or emergency room.  If you have a medical emergency, please immediately call 911 or go to the emergency department.  Pager Numbers  - Dr. Gwen Pounds: [phone]  - Dr. Roseanne Reno: [phone]  - Dr. Katrinka Blazing: [phone]   In the event of inclement weather, please call our main line at [phone] for an update on the status of any delays or closures.  Dermatology Medication Tips: Please keep the boxes that topical medications come in in order to help keep track of the instructions about where and how to use these. Pharmacies typically print the medication instructions only on the boxes and not directly on the medication tubes.   If your medication is too expensive, please contact our office at [phone] option 4 or send [place] a message through MyChart.   We are unable to tell what your co-pay for medications will be in advance as this is different depending on your insurance coverage. However, we may be able to find a substitute medication at lower cost or fill out paperwork to get insurance to cover a needed medication.   If a prior authorization is required to get your medication covered by your insurance company, please allow [place] 1-2 business days to complete this process.  Drug prices often vary depending on where the prescription is filled and some pharmacies may offer  cheaper prices.  The website www.goodrx.com contains coupons for medications through different pharmacies. The prices here do not account for what the cost may be with help from insurance (it may be cheaper with your insurance), but the website can give you the price if you did not use any insurance.  - You can print the associated coupon and take it with your prescription to the pharmacy.  - You may also stop by our office during regular business hours and pick up a GoodRx coupon card.  - If you need your prescription sent electronically to a different pharmacy, notify our office through [facility] or by phone at [phone] option 4.     Si Usted Necesita Algo Despus de Su Visita  Tambin puede enviarnos un mensaje a travs de Clinical cytogeneticist. Por lo general respondemos a los mensajes de MyChart en el transcurso de 1 a 2 das hbiles.  Para renovar recetas, por favor pida a su farmacia que se ponga en contacto con nuestra oficina. Annie Sable de fax es [place] [phone]. **Note De-Identified [place] Obfuscation** Si tiene un asunto urgente cuando la clnica est cerrada y que no puede esperar hasta el siguiente da hbil, puede llamar/localizar a su doctor(a) al nmero que aparece a continuacin.   Por favor, tenga en cuenta que aunque hacemos todo lo posible para estar disponibles para asuntos urgentes fuera del horario de [place], no estamos disponibles las 24 horas del da, los 7 [address]  [address] de la [place].   Si tiene un problema urgente y no puede comunicarse con nosotros, puede optar por buscar atencin mdica  en el consultorio de su doctor(a), en una clnica privada, en un centro de atencin urgente o en una sala de emergencias.  Si tiene Engineer, drilling, por favor llame inmediatamente al 911 o vaya a la sala de emergencias.  Nmeros de bper  - Dr. Gwen Pounds: [phone]  - Dra. Roseanne Reno: [phone]  - Dr. Katrinka Blazing: [phone]   En caso de inclemencias del tiempo, por favor llame a Lacy Duverney principal al  [phone] para una actualizacin sobre el [place] de cualquier retraso o cierre.  Consejos para la medicacin en dermatologa: Por favor, guarde las cajas en las que vienen los medicamentos de uso tpico para ayudarle a seguir las instrucciones sobre dnde y cmo usarlos. Las farmacias generalmente imprimen las instrucciones del medicamento slo en las cajas y no directamente en los tubos del [place].   Si su medicamento es muy caro, por favor, pngase en contacto con Rolm Gala llamando al [phone] y presione la opcin 4 o envenos un mensaje a travs de Clinical cytogeneticist.   No podemos decirle cul ser su copago por los medicamentos por adelantado ya que esto es diferente dependiendo de la cobertura de su seguro. Sin embargo, es posible que podamos encontrar un medicamento sustituto a Audiological scientist un formulario para que el seguro cubra el medicamento que se considera necesario.   Si se requiere una autorizacin previa para que su compaa de seguros [place] su medicamento, por favor permtanos de 1 a 2 das hbiles para completar [address]  Los precios de los medicamentos varan con frecuencia dependiendo del Environmental consultant de dnde se surte la receta y alguna farmacias pueden ofrecer precios ms baratos.  El sitio web www.goodrx.com tiene cupones para medicamentos de Health and safety inspector. Los precios aqu no tienen en cuenta lo que podra costar con la ayuda del seguro (puede ser ms barato con su seguro), pero el sitio web puede darle el precio si no utiliz Tourist information centre manager.  - Puede imprimir el cupn correspondiente y llevarlo con su receta a la farmacia.  - Tambin puede pasar por nuestra oficina durante el horario de atencin regular y Education officer, museum una tarjeta de cupones de GoodRx.  - Si necesita que su receta se enve electrnicamente a una farmacia diferente, informe a nuestra oficina a travs de MyChart de [place] o por telfono llamando al [phone] y presione la opcin 4.

## 2024-01-23 NOTE — Progress Notes (Signed)
**Note De-Identified Spoerl Obfuscation** New Patient Visit   Subjective  Analyah V Jefferson is a 43 y.o. female who presents for the following: spot at chest she would like checked, noticed a few months ago. Raised spots on right palm of hand, patient has been cutting off the spots, noticed over a year. Patient also has spots at face, chest, legs and arms she would like checked today.  The patient has spots, moles and lesions to be evaluated, some may be new or changing and the patient may have concern these could be cancer.  The following portions of the chart were reviewed this encounter and updated as appropriate: medications, allergies, medical history  Review of Systems:  No other skin or systemic complaints except as noted in HPI or Assessment and Plan.  Objective  Well appearing patient in no apparent distress; mood and affect are within normal limits.   A focused examination was performed of the following areas: Arms, legs, face, chest, right palm   Relevant exam findings are noted in the Assessment and Plan.       right palm of hand x 2 (2) 2 warts at right palm Verrucous papules -- Discussed viral etiology and contagion.  May be associated with splinter  right chest x 1 Erythematous thin papules/macules with gritty scale.    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 - Observe - Call for any changes   LENTIGINES At face,  Exam: scattered tan macules Due to sun exposure Treatment Plan: Benign-appearing, observe. Recommend daily broad spectrum sunscreen SPF 30+ to sun-exposed areas, reapply every 2 hours as needed.  Call for any changes   ACTINIC DAMAGE - chronic, secondary to cumulative UV radiation exposure/sun exposure over time - diffuse scaly erythematous macules with underlying dyspigmentation -  Recommend daily broad spectrum sunscreen SPF 30+ to sun-exposed areas, reapply every 2 hours as needed.  - Recommend staying in the shade or wearing long sleeves, sun glasses (UVA+UVB protection) and wide brim hats (4-inch brim around the entire circumference of the hat). - Call for new or changing lesions.  VIRAL WARTS, UNSPECIFIED TYPE (2) right palm of hand x 2 (2) Viral Wart (HPV) Counseling  Discussed viral / HPV (Human Papilloma Virus) etiology and risk of spread /infectivity to other areas of body as well as to other people.  Multiple treatments and methods may be required to clear warts and it is possible treatment may not be successful.  Treatment risks include discoloration; scarring and there is still potential for wart recurrence. Destruction of lesion - right palm of hand x 2 (2) Complexity: simple   Destruction method: cryotherapy   Informed consent: discussed and consent obtained   Timeout:  patient name, date of birth, surgical site, and procedure verified Lesion destroyed using liquid nitrogen: Yes   Region frozen until ice ball extended beyond lesion: Yes   Outcome: patient tolerated procedure well with no complications   Post-procedure details: wound care instructions given   ACTINIC KERATOSIS right chest x 1 Will recheck at next follow up  Actinic keratoses are precancerous spots that appear secondary to cumulative UV radiation exposure/sun exposure over time. They are chronic with expected duration over 1 year. A portion of actinic keratoses will progress to squamous cell carcinoma of the skin. It is not possible to reliably **Note De-Identified Gothard Obfuscation** predict which spots will progress to skin cancer and so treatment is recommended to prevent development of skin cancer.  Recommend daily broad spectrum sunscreen SPF 30+ to sun-exposed areas, reapply every 2 hours as needed.  Recommend staying in the shade or wearing long sleeves, sun glasses (UVA+UVB protection) and wide brim hats (4-inch brim around  the entire circumference of the hat). Call for new or changing lesions. Destruction of lesion - right chest x 1 Complexity: simple   Destruction method: cryotherapy   Informed consent: discussed and consent obtained   Timeout:  patient name, date of birth, surgical site, and procedure verified Lesion destroyed using liquid nitrogen: Yes   Region frozen until ice ball extended beyond lesion: Yes   Outcome: patient tolerated procedure well with no complications   Post-procedure details: wound care instructions given    Return for 4 - 6 month recheck ak .  IAsher Jefferson, CMA, am acting as scribe for Armida Sans, MD.   Documentation: I have reviewed the above documentation for accuracy and completeness, and I agree with the above.  Armida Sans, MD

## 2024-01-24 ENCOUNTER — Encounter: Payer: Self-pay | Admitting: Internal Medicine

## 2024-01-24 LAB — TSH: TSH: 1.98 u[IU]/mL (ref 0.35–5.50)

## 2024-02-06 ENCOUNTER — Other Ambulatory Visit: Payer: Self-pay | Admitting: Internal Medicine

## 2024-02-06 DIAGNOSIS — Z1231 Encounter for screening mammogram for malignant neoplasm of breast: Secondary | ICD-10-CM

## 2024-02-07 ENCOUNTER — Ambulatory Visit
Admission: RE | Admit: 2024-02-07 | Discharge: 2024-02-07 | Disposition: A | Discharge status: Home / Self Care | Source: Ambulatory Visit | Attending: Internal Medicine | Admitting: Internal Medicine

## 2024-02-07 DIAGNOSIS — Z1231 Encounter for screening mammogram for malignant neoplasm of breast: Secondary | ICD-10-CM | POA: Diagnosis not present

## 2024-04-05 ENCOUNTER — Other Ambulatory Visit: Payer: Self-pay | Admitting: Internal Medicine

## 2024-04-10 ENCOUNTER — Telehealth: Payer: Self-pay | Admitting: Internal Medicine

## 2024-04-10 NOTE — Telephone Encounter (Signed)
**Note De-Identified Kauth Obfuscation** Prescription Request  04/10/2024  LOV: 10/27/2023  What is the name of the medication or equipment? levothyroxine  (SYNTHROID ) 50 MCG tablet  estradiol  (ESTRACE ) 1 MG tablet  SYRINGE-NEEDLE, DISP, 3 ML (B-D 3CC LUER-LOK SYR 25GX cyanocobalamin  (VITAMIN B12) 1000 MCG/ML injection 1) 25G X 1 3 ML    Have you contacted your pharmacy to request a refill? No   Which pharmacy would you like this sent to? [facility] - [place] - [address]     Patient notified that their request is being sent to the clinical staff for review and that they should receive a response within 2 business days.   Please advise at Mobile [phone] (mobile)

## 2024-04-11 ENCOUNTER — Other Ambulatory Visit: Payer: Self-pay

## 2024-04-11 ENCOUNTER — Encounter: Payer: Self-pay | Admitting: Internal Medicine

## 2024-04-11 MED ORDER — LEVOTHYROXINE SODIUM 50 MCG PO TABS: 50.0000 ug | ORAL_TABLET | Freq: Every day | ORAL | 1 refills | Status: DC

## 2024-04-11 NOTE — Telephone Encounter (Signed)
 Medication refilled

## 2024-04-12 MED ORDER — ESTRADIOL 1 MG PO TABS: 1.0000 mg | ORAL_TABLET | Freq: Every day | ORAL | 1 refills | Status: DC

## 2024-04-12 MED ORDER — CYANOCOBALAMIN 1000 MCG/ML IJ SOLN
INTRAMUSCULAR | 1 refills | Status: DC
Start: 2024-04-12 — End: 2024-09-14

## 2024-04-12 MED ORDER — BD LUER-LOK SYRINGE 25G X 1" 3 ML MISC: 3 refills | Status: AC

## 2024-04-16 ENCOUNTER — Telehealth: Payer: Self-pay

## 2024-04-16 NOTE — Telephone Encounter (Signed)
Patient will do same day labs.

## 2024-04-16 NOTE — Telephone Encounter (Signed)
**Note De-Identified Slabach Obfuscation** Copied from CRM [id number]. Topic: Clinical - Request for Lab/Test Order >> Apr 16, 2024  9:59 AM Montie POUR wrote: Reason for CRM:  Sandy Jefferson rescheduled her physical for 07/06/24. She wants to know if Dr. Glendia wants her to come in a week before to complete labs. Please send her a message through MyChart on what needs to be done about labs. There is not future orders for labs in her chart. Thanks

## 2024-04-27 ENCOUNTER — Encounter: Payer: Self-pay | Admitting: Internal Medicine

## 2024-04-30 MED ORDER — TRIAMCINOLONE ACETONIDE 0.1 % EX CREA
1.0000 | TOPICAL_CREAM | Freq: Two times a day (BID) | CUTANEOUS | 0 refills | Status: AC
Start: 2024-04-30 — End: ?

## 2024-04-30 NOTE — Telephone Encounter (Signed)
Rx sent in for triamcinolone cream.

## 2024-05-01 ENCOUNTER — Other Ambulatory Visit: Payer: Self-pay | Admitting: Internal Medicine

## 2024-05-01 ENCOUNTER — Encounter: Payer: BC Managed Care – PPO | Admitting: Internal Medicine

## 2024-05-09 ENCOUNTER — Encounter: Payer: Self-pay | Admitting: Internal Medicine

## 2024-05-22 ENCOUNTER — Encounter: Payer: Self-pay | Admitting: Internal Medicine

## 2024-05-23 ENCOUNTER — Ambulatory Visit (INDEPENDENT_AMBULATORY_CARE_PROVIDER_SITE_OTHER): Admitting: Nurse Practitioner

## 2024-05-23 VITALS — BP 118/76 | HR 79 | Temp 98.4°F | Ht 67.0 in | Wt 146.6 lb

## 2024-05-23 DIAGNOSIS — L237 Allergic contact dermatitis due to plants, except food: Secondary | ICD-10-CM

## 2024-05-23 MED ORDER — DEXAMETHASONE SODIUM PHOSPHATE 10 MG/ML IJ SOLN
8.0000 mg | Freq: Once | INTRAMUSCULAR | Status: AC
  Administered 2024-05-23: 8 mg via INTRAMUSCULAR

## 2024-05-23 MED ORDER — METHYLPREDNISOLONE 4 MG PO TBPK: ORAL_TABLET | ORAL | 0 refills | Status: AC

## 2024-05-23 NOTE — Progress Notes (Signed)
**Note De-Identified Meadors Obfuscation** Leron Glance, NP-C Phone: [phone]  Sandy Jefferson is a 43 y.o. female who presents today for rash.   Discussed the use of AI scribe software for clinical note transcription with the patient, who gave verbal consent to proceed.  History of Present Illness   Sandy Jefferson is a 43 year old female who presents with a widespread itchy rash.  She has a rash primarily located around her ankles, back, and stomach, described as extremely itchy and worsening over time. She spends a lot of time outdoors and has a history of exposure to poison oak, although she feels this rash does not resemble her previous poison oak rashes, except for a spot on her stomach.  To manage the itching, she uses rubbing alcohol for temporary relief and takes Benadryl at night to aid sleep. She avoids daytime antihistamines like Zyrtec and Claritin due to side effects such as grogginess and feeling sick.  In 2015, she experienced a severe reaction to poison oak that required a cortisone shot and a 30-day course of prednisone . The reaction was extensive, affecting her blood, and was treated by a dermatologist. She denies taking any other oral medications during the day due to adverse effects.      Social History   Tobacco Use  Smoking Status Never  Smokeless Tobacco Never    Current Outpatient Medications on File Prior to Visit  Medication Sig Dispense Refill   Alpha-Lipoic Acid 600 MG TABS Take 600 mg by mouth daily.     cyanocobalamin  (VITAMIN B12) 1000 MCG/ML injection INJECT 1 ML (1000 MCG) INTO THE MUSCLE EVERY 30 DAYS, USE NEEDLES AND SYRINGES FOR ADMINISTRATION 3 mL 1   estradiol  (ESTRACE ) 1 MG tablet TAKE 1 TABLET DAILY 90 tablet 1   levothyroxine  (SYNTHROID ) 50 MCG tablet Take 1 tablet (50 mcg total) by mouth daily. 90 tablet 1   SYRINGE-NEEDLE, DISP, 3 ML (B-D 3CC LUER-LOK SYR 25GX1) 25G X 1 3 ML MISC USE 1 SYRINGE FOR EACH B-12 ADMINISTRATION ONCE A MONTH 3 each 3   triamcinolone  cream (KENALOG ) 0.1 %  Apply 1 Application topically 2 (two) times daily. Avoid applying to face, breasts and genitals. 30 g 0   pantoprazole  (PROTONIX ) 40 MG tablet Take 1 tablet (40 mg total) by mouth daily. 90 tablet 3   No current facility-administered medications on file prior to visit.     ROS see history of present illness  Objective  Physical Exam Vitals:   05/23/24 1345  BP: 118/76  Pulse: 79  Temp: 98.4 F (36.9 C)  SpO2: 97%    BP Readings from Last 3 Encounters:  05/23/24 118/76  10/27/23 110/70  04/26/23 118/70   Wt Readings from Last 3 Encounters:  05/23/24 146 lb 9.6 oz (66.5 kg)  10/27/23 165 lb 3.2 oz (74.9 kg)  04/26/23 162 lb 12.8 oz (73.8 kg)    Physical Exam Constitutional:      General: She is not in acute distress.    Appearance: Normal appearance.  HENT:     Head: Normocephalic.  Cardiovascular:     Rate and Rhythm: Normal rate and regular rhythm.     Heart sounds: Normal heart sounds.  Pulmonary:     Effort: Pulmonary effort is normal.     Breath sounds: Normal breath sounds.  Skin:    General: Skin is warm and dry.     Findings: Rash (bilateral elbows, right ankle, lower abdomen) present.     Comments: See pictures below  Neurological: **Note De-Identified Weick Obfuscation** General: No focal deficit present.     Mental Status: She is alert.  Psychiatric:        Mood and Affect: Mood normal.        Behavior: Behavior normal.        Assessment/Plan: Please see individual problem list.  Allergic dermatitis due to poison ivy Assessment & Plan: Suspected poison ivy exposure with severe itching. Shingles ruled out due to rash crossing midline and absence of blistering. History of severe reactions to poison oak, previously tolerated steroids. Administer intramuscular dexamethasone  injection. Prescribe oral steroids if symptoms persist after a few days. Recommend Benadryl at bedtime for itching relief. Advise non-sedating antihistamine such as Zyrtec or Claritin in the morning. Instruct to  contact the clinic if symptoms do not improve within a week.   Orders: -     dexAMETHasone  Sodium Phosphate  -     methylPREDNISolone ; Take as directed.  Dispense: 21 each; Refill: 0     Return if symptoms worsen or fail to improve.   Leron Glance, NP-C [facility] - [facility]

## 2024-05-23 NOTE — Telephone Encounter (Signed)
**Note De-Identified Eskelson Obfuscation** Pt has rash on stomach, both elbows and ankles. Itches. Explained to pt that shingles typically does not cross the midline of the body. Scheduled acute visit for evaluation

## 2024-05-23 NOTE — Progress Notes (Signed)
**Note De-Identified Conly Obfuscation** Verbal order provided to give pt 8 mg of dexamethasone . Pt tolerated injection well in the right thigh. 0.8 mL was drawn up with a 0.25mL waste.

## 2024-05-28 ENCOUNTER — Ambulatory Visit: Admitting: Dermatology

## 2024-05-30 ENCOUNTER — Encounter: Payer: Self-pay | Admitting: Nurse Practitioner

## 2024-05-30 NOTE — Assessment & Plan Note (Signed)
**Note De-Identified Kushner Obfuscation** Suspected poison ivy exposure with severe itching. Shingles ruled out due to rash crossing midline and absence of blistering. History of severe reactions to poison oak, previously tolerated steroids. Administer intramuscular dexamethasone  injection. Prescribe oral steroids if symptoms persist after a few days. Recommend Benadryl at bedtime for itching relief. Advise non-sedating antihistamine such as Zyrtec or Claritin in the morning. Instruct to contact the clinic if symptoms do not improve within a week.

## 2024-05-31 ENCOUNTER — Telehealth: Payer: Self-pay | Admitting: Internal Medicine

## 2024-06-11 NOTE — Telephone Encounter (Signed)
**Note De-Identified Lachman Obfuscation** LM for patient. Please relay message from Dr Glendia below.

## 2024-06-11 NOTE — Telephone Encounter (Signed)
**Note De-Identified Cooler Obfuscation** Would you like to see pt? She has new area and stomach and arm are still bad

## 2024-06-11 NOTE — Addendum Note (Signed)
**Note De-Identified Kasson Obfuscation** Addended by: Garion Wempe on: 06/11/2024 10:37 AM   Modules accepted: Orders

## 2024-06-11 NOTE — Telephone Encounter (Signed)
**Note De-Identified Vandegrift Obfuscation** If persistent problems and the skin lesions are not clearing up, does need to be reevaluated. I can also place order for dermatology referral.

## 2024-06-13 NOTE — Telephone Encounter (Signed)
**Note De-Identified Jehle Obfuscation** Patient says she thinks it is clearing up. Declined dermatology referral and says she will call back next week if not fully resolved to schedule appt to be re-evaluated.

## 2024-07-06 ENCOUNTER — Encounter: Payer: Self-pay | Admitting: Internal Medicine

## 2024-07-06 ENCOUNTER — Ambulatory Visit (INDEPENDENT_AMBULATORY_CARE_PROVIDER_SITE_OTHER): Admitting: Internal Medicine

## 2024-07-06 VITALS — BP 116/70 | HR 74 | Resp 16 | Ht 67.0 in | Wt 140.6 lb

## 2024-07-06 DIAGNOSIS — K5900 Constipation, unspecified: Secondary | ICD-10-CM | POA: Diagnosis not present

## 2024-07-06 DIAGNOSIS — E781 Pure hyperglyceridemia: Secondary | ICD-10-CM | POA: Diagnosis not present

## 2024-07-06 DIAGNOSIS — Z1211 Encounter for screening for malignant neoplasm of colon: Secondary | ICD-10-CM

## 2024-07-06 DIAGNOSIS — Z0000 Encounter for general adult medical examination without abnormal findings: Secondary | ICD-10-CM

## 2024-07-06 DIAGNOSIS — R5383 Other fatigue: Secondary | ICD-10-CM

## 2024-07-06 DIAGNOSIS — E559 Vitamin D deficiency, unspecified: Secondary | ICD-10-CM | POA: Diagnosis not present

## 2024-07-06 DIAGNOSIS — E2839 Other primary ovarian failure: Secondary | ICD-10-CM

## 2024-07-06 DIAGNOSIS — E538 Deficiency of other specified B group vitamins: Secondary | ICD-10-CM

## 2024-07-06 DIAGNOSIS — R140 Abdominal distension (gaseous): Secondary | ICD-10-CM | POA: Diagnosis not present

## 2024-07-06 DIAGNOSIS — E038 Other specified hypothyroidism: Secondary | ICD-10-CM

## 2024-07-06 LAB — CBC WITH DIFFERENTIAL/PLATELET
Basophils Absolute: 0 K/uL (ref 0.0–0.1)
Basophils Relative: 0.8 % (ref 0.0–3.0)
Eosinophils Absolute: 0.1 K/uL (ref 0.0–0.7)
Eosinophils Relative: 3.1 % (ref 0.0–5.0)
HCT: 38.7 % (ref 36.0–46.0)
Hemoglobin: 13.1 g/dL (ref 12.0–15.0)
Lymphocytes Relative: 20 % (ref 12.0–46.0)
Lymphs Abs: 0.9 K/uL (ref 0.7–4.0)
MCHC: 33.8 g/dL (ref 30.0–36.0)
MCV: 91.2 fl (ref 78.0–100.0)
Monocytes Absolute: 0.3 K/uL (ref 0.1–1.0)
Monocytes Relative: 7.7 % (ref 3.0–12.0)
Neutro Abs: 2.9 K/uL (ref 1.4–7.7)
Neutrophils Relative %: 68.4 % (ref 43.0–77.0)
Platelets: 200 K/uL (ref 150.0–400.0)
RBC: 4.25 Mil/uL (ref 3.87–5.11)
RDW: 13.3 % (ref 11.5–15.5)
WBC: 4.3 K/uL (ref 4.0–10.5)

## 2024-07-06 LAB — LIPID PANEL
Cholesterol: 162 mg/dL (ref 0–200)
HDL: 50.3 mg/dL (ref >39.00)
LDL Cholesterol: 97 mg/dL (ref 0–99)
NonHDL: 111.72
Total CHOL/HDL Ratio: 3
Triglycerides: 72 mg/dL (ref 0.0–149.0)
VLDL: 14.4 mg/dL (ref 0.0–40.0)

## 2024-07-06 LAB — HEPATIC FUNCTION PANEL
ALT: 12 U/L (ref 0–35)
AST: 11 U/L (ref 0–37)
Albumin: 4.2 g/dL (ref 3.5–5.2)
Alkaline Phosphatase: 32 U/L — ABNORMAL LOW (ref 39–117)
Bilirubin, Direct: 0.2 mg/dL (ref 0.0–0.3)
Total Bilirubin: 1.2 mg/dL (ref 0.2–1.2)
Total Protein: 6.5 g/dL (ref 6.0–8.3)

## 2024-07-06 LAB — BASIC METABOLIC PANEL WITH GFR
BUN: 19 mg/dL (ref 6–23)
CO2: 29 meq/L (ref 19–32)
Calcium: 9 mg/dL (ref 8.4–10.5)
Chloride: 103 meq/L (ref 96–112)
Creatinine, Ser: 0.89 mg/dL (ref 0.40–1.20)
GFR: 78.45 mL/min (ref >60.00)
Glucose, Bld: 92 mg/dL (ref 70–99)
Potassium: 3.8 meq/L (ref 3.5–5.1)
Sodium: 139 meq/L (ref 135–145)

## 2024-07-06 LAB — TSH: TSH: 2.87 u[IU]/mL (ref 0.35–5.50)

## 2024-07-06 LAB — VITAMIN D 25 HYDROXY (VIT D DEFICIENCY, FRACTURES): VITD: 45.3 ng/mL (ref 30.00–100.00)

## 2024-07-06 LAB — VITAMIN B12: Vitamin B-12: 627 pg/mL (ref 211–911)

## 2024-07-06 NOTE — Assessment & Plan Note (Signed)
**Note De-Identified Tapanes Obfuscation** Physical today 07/06/24.  Mammogram - 01/01/23 - Birads 0.  F/u right breast mammogram - Birads II.  Continue yearly mammogram screening.  Mammogram 02/07/24 - Birads I. S/p hysterectomy. Agre 43 now. Agreeable for referral to GI for colonoscopy.

## 2024-07-06 NOTE — Progress Notes (Unsigned)
**Note De-Identified Hanway Obfuscation** Subjective:    Patient ID: Sandy Jefferson, female    DOB: Jun 10, 1981, 43 y.o.   MRN: [id number]  Patient here for  Chief Complaint  Patient presents with   Annual Exam    HPI Here for a physical exam. Has has previously had issues with abdominal bloating and constipation. Recommended trial of linzess. Discussed due colonoscopy - age 78. Ok for referral. Increased stress. Recently separated. Seeing a counselor Rosy Lunger) weekly. This is going well. Has good support. Does not feel needs any further intervention at this time. Reports some increased fatigue. Discussed. She has started pilates. Has started exercising. Breathing stable. No GI issues. Does wake up feeling rested. Taking vitamin B12 and vitamin D  supplements. Discussed rechecking levels.    Past Medical History:  Diagnosis Date   Bladder troubles    Migraine headache    Past Surgical History:  Procedure Laterality Date   CESAREAN SECTION     HERNIA REPAIR     HYSTEROSCOPY     TOTAL ABDOMINAL HYSTERECTOMY W/ BILATERAL SALPINGOOPHORECTOMY  04/2014   Family History  Problem Relation Age of Onset   Heart disease Brother    Hyperlipidemia Mother    Hyperlipidemia Father    Cancer Maternal Aunt        breast   Breast cancer Maternal Aunt        late 40's   Stroke Maternal Grandmother    Breast cancer Maternal Grandmother        great   Ovarian cancer Maternal Grandmother    Colon cancer Maternal Grandmother    Hyperlipidemia Maternal Grandfather    Cancer Paternal Grandmother        lung   Diabetes Paternal Grandmother    Diabetes Paternal Grandfather    Colon cancer Other        great grandmother   Social History   Socioeconomic History   Marital status: Married    Spouse name: Not on file   Number of children: 4   Years of education: Not on file   Highest education level: Bachelor's degree (e.g., BA, AB, BS)  Occupational History   Not on file  Tobacco Use   Smoking status: Never   Smokeless  tobacco: Never  Substance and Sexual Activity   Alcohol use: Yes    Alcohol/week: 0.0 standard drinks of alcohol    Comment: rarely   Drug use: No   Sexual activity: Not on file  Other Topics Concern   Not on file  Social History Narrative   Not on file   Social Drivers of Health   Financial Resource Strain: Low Risk  (05/23/2024)   Overall Financial Resource Strain (CARDIA)    Difficulty of Paying Living Expenses: Not hard at all  Food Insecurity: No Food Insecurity (05/23/2024)   Hunger Vital Sign    Worried About Running Out of Food in the Last Year: Never true    Ran Out of Food in the Last Year: Never true  Transportation Needs: No Transportation Needs (05/23/2024)   PRAPARE - Administrator, Civil Service (Medical): No    Lack of Transportation (Non-Medical): No  Physical Activity: Inactive (05/23/2024)   Exercise Vital Sign    Days of Exercise per Week: 0 days    Minutes of Exercise per Session: Not on file  Stress: Stress Concern Present (05/23/2024)   [employer] of Occupational Health - Occupational Stress Questionnaire    Feeling of Stress: Very much  Social Connections: Moderately **Note De-Identified Sainz Obfuscation** Integrated (05/23/2024)   Social Connection and Isolation Panel    Frequency of Communication with Friends and Family: More than three times a week    Frequency of Social Gatherings with Friends and Family: More than three times a week    Attends Religious Services: More than 4 times per year    Active Member of [employer] or Organizations: Yes    Attends Engineer, structural: More than 4 times per year    Marital Status: Separated     Review of Systems  Constitutional:  Positive for fatigue. Negative for appetite change and unexpected weight change.  HENT:  Negative for congestion, sinus pressure and sore throat.   Eyes:  Negative for pain and visual disturbance.  Respiratory:  Negative for cough, chest tightness and shortness of breath.   Cardiovascular:  Negative  for chest pain, palpitations and leg swelling.  Gastrointestinal:  Negative for abdominal pain, diarrhea, nausea and vomiting.  Genitourinary:  Negative for difficulty urinating and dysuria.  Musculoskeletal:  Negative for joint swelling and myalgias.  Skin:  Negative for color change and rash.  Neurological:  Negative for dizziness and headaches.  Hematological:  Negative for adenopathy. Does not bruise/bleed easily.  Psychiatric/Behavioral:  Negative for agitation and dysphoric mood.        Objective:     BP 116/70   Pulse 74   Resp 16   Ht 5' 7 (1.702 m)   Wt 140 lb 9.6 oz (63.8 kg)   SpO2 98%   BMI 22.02 kg/m  Wt Readings from Last 3 Encounters:  07/06/24 140 lb 9.6 oz (63.8 kg)  05/23/24 146 lb 9.6 oz (66.5 kg)  10/27/23 165 lb 3.2 oz (74.9 kg)    Physical Exam Vitals reviewed.  Constitutional:      General: She is not in acute distress.    Appearance: Normal appearance. She is well-developed.  HENT:     Head: Normocephalic and atraumatic.     Right Ear: External ear normal.     Left Ear: External ear normal.     Mouth/Throat:     Pharynx: No oropharyngeal exudate or posterior oropharyngeal erythema.  Eyes:     General: No scleral icterus.       Right eye: No discharge.        Left eye: No discharge.     Conjunctiva/sclera: Conjunctivae normal.  Neck:     Thyroid : No thyromegaly.  Cardiovascular:     Rate and Rhythm: Normal rate and regular rhythm.  Pulmonary:     Effort: No tachypnea, accessory muscle usage or respiratory distress.     Breath sounds: Normal breath sounds. No decreased breath sounds or wheezing.  Chest:  Breasts:    Right: No inverted nipple, mass, nipple discharge or tenderness (no axillary adenopathy).     Left: No inverted nipple, mass, nipple discharge or tenderness (no axilarry adenopathy).  Abdominal:     General: Bowel sounds are normal.     Palpations: Abdomen is soft.     Tenderness: There is no abdominal tenderness.   Musculoskeletal:        General: No swelling or tenderness.     Cervical back: Neck supple.  Lymphadenopathy:     Cervical: No cervical adenopathy.  Skin:    Findings: No erythema or rash.  Neurological:     Mental Status: She is alert and oriented to person, place, and time.  Psychiatric:        Mood and Affect: Mood normal. **Note De-Identified Ramer Obfuscation** Behavior: Behavior normal.         Outpatient Encounter Medications as of 07/06/2024  Medication Sig   Alpha-Lipoic Acid 600 MG TABS Take 600 mg by mouth daily.   cyanocobalamin  (VITAMIN B12) 1000 MCG/ML injection INJECT 1 ML (1000 MCG) INTO THE MUSCLE EVERY 30 DAYS, USE NEEDLES AND SYRINGES FOR ADMINISTRATION   estradiol  (ESTRACE ) 1 MG tablet TAKE 1 TABLET DAILY   levothyroxine  (SYNTHROID ) 50 MCG tablet Take 1 tablet (50 mcg total) by mouth daily.   methylPREDNISolone  (MEDROL  DOSEPAK) 4 MG TBPK tablet Take as directed.   pantoprazole  (PROTONIX ) 40 MG tablet Take 1 tablet (40 mg total) by mouth daily.   SYRINGE-NEEDLE, DISP, 3 ML (B-D 3CC LUER-LOK SYR 25GX1) 25G X 1 3 ML MISC USE 1 SYRINGE FOR EACH B-12 ADMINISTRATION ONCE A MONTH   triamcinolone  cream (KENALOG ) 0.1 % Apply 1 Application topically 2 (two) times daily. Avoid applying to face, breasts and genitals.   No facility-administered encounter medications on file as of 07/06/2024.     Lab Results  Component Value Date   WBC 4.3 07/06/2024   HGB 13.1 07/06/2024   HCT 38.7 07/06/2024   PLT 200.0 07/06/2024   GLUCOSE 92 07/06/2024   CHOL 162 07/06/2024   TRIG 72.0 07/06/2024   HDL 50.30 07/06/2024   LDLCALC 97 07/06/2024   ALT 12 07/06/2024   AST 11 07/06/2024   NA 139 07/06/2024   K 3.8 07/06/2024   CL 103 07/06/2024   CREATININE 0.89 07/06/2024   BUN 19 07/06/2024   CO2 29 07/06/2024   TSH 2.87 07/06/2024    MM 3D SCREENING MAMMOGRAM BILATERAL BREAST Result Date: 02/09/2024 CLINICAL DATA:  Screening. EXAM: DIGITAL SCREENING BILATERAL MAMMOGRAM WITH TOMOSYNTHESIS AND CAD  TECHNIQUE: Bilateral screening digital craniocaudal and mediolateral oblique mammograms were obtained. Bilateral screening digital breast tomosynthesis was performed. The images were evaluated with computer-aided detection. COMPARISON:  Previous exam(s). ACR Breast Density Category c: The breasts are heterogeneously dense, which may obscure small masses. FINDINGS: There are no findings suspicious for malignancy. IMPRESSION: No mammographic evidence of malignancy. A result letter of this screening mammogram will be mailed directly to the patient. RECOMMENDATION: Screening mammogram in one year. (Code:SM-B-01Y) BI-RADS CATEGORY  1: Negative. Electronically Signed   By: Toribio Agreste M.D.   On: 02/09/2024 16:58       Assessment & Plan:  Routine general medical examination at a health care facility  Hypertriglyceridemia Assessment & Plan: Low carb diet and exercise. Follow lipid panel.   Orders: -     Basic metabolic panel with GFR -     Lipid panel -     Hepatic function panel  Abdominal bloating Assessment & Plan: Abdominal bloating with constipation.saw GI.  Recommended linzess.  Had reported taking prn. Due colonoscopy - age 61. Agreeable for referral.   Orders: -     CBC with Differential/Platelet  Constipation, unspecified constipation type -     TSH  Health care maintenance Assessment & Plan: Physical today 07/06/24.  Mammogram - 01/01/23 - Birads 0.  F/u right breast mammogram - Birads II.  Continue yearly mammogram screening.  Mammogram 02/07/24 - Birads I. S/p hysterectomy. Agre 43 now. Agreeable for referral to GI for colonoscopy.    B12 deficiency Assessment & Plan: Check B12 level today.   Orders: -     Vitamin B12  Vitamin D  deficiency Assessment & Plan: Check vitamin D  level today.   Orders: -     VITAMIN D  25 Hydroxy ( **Note De-Identified Carr Obfuscation** Vit-D Deficiency, Fractures)  Colon cancer screening -     Ambulatory referral to Gastroenterology  Estrogen deficiency Assessment & Plan: S/p  TAH/BSO.  Started on estrogen by gyn.     Other fatigue Assessment & Plan: Reports increased fatigue. Discussed probably multifactorial. Increased stress. Seeing a Veterinary surgeon. Started pilates and plans to start exercising more regularly. Check labs, including cbc, tsh, B12 and vitamin D .    Other specified hypothyroidism Assessment & Plan: Continue synthroid . Follow tsh.       Allena Hamilton, MD

## 2024-07-07 ENCOUNTER — Encounter: Payer: Self-pay | Admitting: Internal Medicine

## 2024-07-07 ENCOUNTER — Ambulatory Visit: Payer: Self-pay | Admitting: Internal Medicine

## 2024-07-07 NOTE — Assessment & Plan Note (Signed)
 Continue synthroid. Follow tsh.

## 2024-07-07 NOTE — Assessment & Plan Note (Signed)
**Note De-Identified Billard Obfuscation** Reports increased fatigue. Discussed probably multifactorial. Increased stress. Seeing a Veterinary surgeon. Started pilates and plans to start exercising more regularly. Check labs, including cbc, tsh, B12 and vitamin D .

## 2024-07-07 NOTE — Assessment & Plan Note (Signed)
Check B12 level today.  

## 2024-07-07 NOTE — Assessment & Plan Note (Signed)
Low carb diet and exercise.  Follow lipid panel.  

## 2024-07-07 NOTE — Assessment & Plan Note (Signed)
**Note De-Identified Defrain Obfuscation** Abdominal bloating with constipation.saw GI.  Recommended linzess.  Had reported taking prn. Due colonoscopy - age 43. Agreeable for referral.

## 2024-07-07 NOTE — Assessment & Plan Note (Signed)
**Note De-identified Simar Obfuscation** S/p TAH/BSO.  Started on estrogen by gyn.   

## 2024-07-07 NOTE — Assessment & Plan Note (Signed)
 Check vitamin D level today

## 2024-08-16 ENCOUNTER — Encounter: Payer: Self-pay | Admitting: Internal Medicine

## 2024-08-21 NOTE — Telephone Encounter (Signed)
**Note De-Identified Kunsman Obfuscation** Please call her. Confirm symptoms. If persistent head pain, needs to be seen. Also, find out where she was seen to be able to get office notes.

## 2024-08-22 ENCOUNTER — Ambulatory Visit: Admitting: Internal Medicine

## 2024-08-22 ENCOUNTER — Ambulatory Visit: Payer: Self-pay | Admitting: Internal Medicine

## 2024-08-22 ENCOUNTER — Ambulatory Visit: Payer: Self-pay

## 2024-08-22 ENCOUNTER — Encounter: Payer: Self-pay | Admitting: Internal Medicine

## 2024-08-22 VITALS — BP 138/84 | HR 90 | Ht 67.0 in | Wt 140.8 lb

## 2024-08-22 DIAGNOSIS — R519 Headache, unspecified: Secondary | ICD-10-CM

## 2024-08-22 DIAGNOSIS — R030 Elevated blood-pressure reading, without diagnosis of hypertension: Secondary | ICD-10-CM

## 2024-08-22 LAB — CBC WITH DIFFERENTIAL/PLATELET
Basophils Absolute: 0 K/uL (ref 0.0–0.1)
Basophils Relative: 0.6 % (ref 0.0–3.0)
Eosinophils Absolute: 0.1 K/uL (ref 0.0–0.7)
Eosinophils Relative: 1.3 % (ref 0.0–5.0)
HCT: 39.6 % (ref 36.0–46.0)
Hemoglobin: 13.4 g/dL (ref 12.0–15.0)
Lymphocytes Relative: 14.9 % (ref 12.0–46.0)
Lymphs Abs: 0.9 K/uL (ref 0.7–4.0)
MCHC: 33.8 g/dL (ref 30.0–36.0)
MCV: 91.6 fl (ref 78.0–100.0)
Monocytes Absolute: 0.4 K/uL (ref 0.1–1.0)
Monocytes Relative: 6.7 % (ref 3.0–12.0)
Neutro Abs: 4.5 K/uL (ref 1.4–7.7)
Neutrophils Relative %: 76.5 % (ref 43.0–77.0)
Platelets: 240 K/uL (ref 150.0–400.0)
RBC: 4.32 Mil/uL (ref 3.87–5.11)
RDW: 12.8 % (ref 11.5–15.5)
WBC: 5.9 K/uL (ref 4.0–10.5)

## 2024-08-22 LAB — COMPREHENSIVE METABOLIC PANEL WITH GFR
ALT: 18 U/L (ref 0–35)
AST: 16 U/L (ref 0–37)
Albumin: 4.3 g/dL (ref 3.5–5.2)
Alkaline Phosphatase: 33 U/L — ABNORMAL LOW (ref 39–117)
BUN: 32 mg/dL — ABNORMAL HIGH (ref 6–23)
CO2: 29 meq/L (ref 19–32)
Calcium: 9.1 mg/dL (ref 8.4–10.5)
Chloride: 101 meq/L (ref 96–112)
Creatinine, Ser: 0.83 mg/dL (ref 0.40–1.20)
GFR: 85.23 mL/min (ref >60.00)
Glucose, Bld: 81 mg/dL (ref 70–99)
Potassium: 4.5 meq/L (ref 3.5–5.1)
Sodium: 135 meq/L (ref 135–145)
Total Bilirubin: 0.5 mg/dL (ref 0.2–1.2)
Total Protein: 6.7 g/dL (ref 6.0–8.3)

## 2024-08-22 LAB — SEDIMENTATION RATE: Sed Rate: 2 mm/h (ref 0–20)

## 2024-08-22 LAB — C-REACTIVE PROTEIN: CRP: <0.5 mg/dL (ref 0.5–20.0)

## 2024-08-22 MED ORDER — ONDANSETRON 4 MG PO TBDP: 4.0000 mg | ORAL_TABLET | Freq: Three times a day (TID) | ORAL | 0 refills | Status: AC | PRN

## 2024-08-22 MED ORDER — TRAMADOL HCL 50 MG PO TABS: 50.0000 mg | ORAL_TABLET | Freq: Four times a day (QID) | ORAL | 0 refills | Status: AC | PRN

## 2024-08-22 MED ORDER — CELECOXIB 200 MG PO CAPS: 200.0000 mg | ORAL_CAPSULE | Freq: Two times a day (BID) | ORAL | 0 refills | Status: AC

## 2024-08-22 NOTE — Telephone Encounter (Signed)
**Note De-Identified Aron Obfuscation** FYI Only or Action Required?: FYI only for provider.  Patient was last seen in primary care on 07/06/2024 by Glendia Shad, MD.  Called Nurse Triage reporting Headache.  Symptoms began several weeks ago.  Interventions attempted: OTC medications: Tylenol, Ibuprofen and Prescription medications: Muscle relaxer.  Symptoms are: gradually worsening.  Triage Disposition: See HCP Within 4 Hours (Or PCP Triage)  Patient/caregiver understands and will follow disposition?: Yes          Copied from CRM [id number]. Topic: Clinical - Red Word Triage >> Aug 22, 2024  8:30 AM Rosina BIRCH wrote: Reason for CRM: head pain for about two weeks Reason for Disposition  [1] SEVERE headache (e.g., excruciating) AND [2] not improved after 2 hours of pain medicine  Answer Assessment - Initial Assessment Questions 1. LOCATION: Where does it hurt?      R side above her eye 2. ONSET: When did the headache start? (e.g., minutes, hours, days)      2 weeks  3. PATTERN: Does the pain come and go, or has it been constant since it started?     Constant  4. SEVERITY: How bad is the pain? and What does it keep you from doing?  (e.g., Scale 1-10; mild, moderate, or severe)     7-8/10  5. RECURRENT SYMPTOM: Have you ever had headaches before? If Yes, ask: When was the last time? and What happened that time?      Denies  6. CAUSE: What do you think is causing the headache?     Unsure  7. MIGRAINE: Have you been diagnosed with migraine headaches? If Yes, ask: Is this headache similar?      Yes, has a hx of migraines  8. HEAD INJURY: Has there been any recent injury to your head?      Denies  9. OTHER SYMPTOMS: Do you have any other symptoms? (e.g., fever, stiff neck, eye pain, sore throat, cold symptoms)     Fatigue, nausea, felt clammy yesterday, head feels swimmy    Patient taking tyelnol and ibuprofen without relief. Also taking a muscle relaxer for symptoms given at [facility] when seen  for symptoms. Pt states the area is tender to touch.  Protocols used: [facility]

## 2024-08-22 NOTE — Telephone Encounter (Signed)
**Note De-Identified Altier Obfuscation** Noted. Need office notes - from where she was sene.

## 2024-08-22 NOTE — Progress Notes (Unsigned)
**Note De-Identified Balding Obfuscation** Subjective:  Patient ID: Sandy Jefferson, female    DOB: 1981-07-26  Age: 43 y.o. MRN: [id number]  CC: There were no encounter diagnoses.   HPI Sandy Jefferson presents for  Chief Complaint  Patient presents with   Headache    Right side temporal area head pain x 10 days. Shooting, burning pain. Hurts for anything to touch the area, can't lay on that side of face, hurts when she moves her hair on that side. Pain level is currently an 7.   43 yr old female  with history of untreated bruxism  and frequent  migraine headache without aura  (usually on the left  side) presents with right sided temporal headache that has been present for ten days  and described  as severe,  shooting  quality . Right side of forehead and Scalp is tender, chewing aggravates it ,  combing hair hurts.  No blurring of vision but   Seeing tiny points of light, and describes things looking like they are in 3D when she focuses vision immediately after opening her eye.  She  went to walk in clinic one week after onset with urinary freqruency also reported,  told she had a UTI and a tension headache and   Given tizanidine and Septra l no change  .nausea started today .  Accompanied by  BY FACIAL PAIN described as burning , shooting.    Took  a friend's imitrex 25 mg which helped for a short period (several hours) but pain returned.  Using excedrin excessively.   BP has been abnormally elevated for the last several days.  No FH of autoimmune idsease.   Completed Septra x 3 days for UTI diagnosed during oct 25 [facility] walk in clinic   (symptoms of frequency have now resolved)    Outpatient Medications Prior to Visit  Medication Sig Dispense Refill   Alpha-Lipoic Acid 600 MG TABS Take 600 mg by mouth daily.     cyanocobalamin  (VITAMIN B12) 1000 MCG/ML injection INJECT 1 ML (1000 MCG) INTO THE MUSCLE EVERY 30 DAYS, USE NEEDLES AND SYRINGES FOR ADMINISTRATION 3 mL 1   estradiol  (ESTRACE ) 1 MG tablet TAKE 1 TABLET DAILY 90 tablet 1    levothyroxine  (SYNTHROID ) 50 MCG tablet Take 1 tablet (50 mcg total) by mouth daily. 90 tablet 1   methylPREDNISolone  (MEDROL  DOSEPAK) 4 MG TBPK tablet Take as directed. 21 each 0   pantoprazole  (PROTONIX ) 40 MG tablet Take 1 tablet (40 mg total) by mouth daily. 90 tablet 3   SYRINGE-NEEDLE, DISP, 3 ML (B-D 3CC LUER-LOK SYR 25GX1) 25G X 1 3 ML MISC USE 1 SYRINGE FOR EACH B-12 ADMINISTRATION ONCE A MONTH 3 each 3   triamcinolone  cream (KENALOG ) 0.1 % Apply 1 Application topically 2 (two) times daily. Avoid applying to face, breasts and genitals. 30 g 0   No facility-administered medications prior to visit.    Review of Systems;  Patient denies headache, fevers, malaise, unintentional weight loss, skin rash, eye pain, sinus congestion and sinus pain, sore throat, dysphagia,  hemoptysis , cough, dyspnea, wheezing, chest pain, palpitations, orthopnea, edema, abdominal pain, nausea, melena, diarrhea, constipation, flank pain, dysuria, hematuria, urinary  Frequency, nocturia, numbness, tingling, seizures,  Focal weakness, Loss of consciousness,  Tremor, insomnia, depression, anxiety, and suicidal ideation.      Objective:  BP 138/84   Pulse 90   Ht 5' 7 (1.702 m)   Wt 140 lb 12.8 oz (63.9 kg)   SpO2 97% **Note De-Identified Go Obfuscation** BMI 22.05 kg/m   BP Readings from Last 3 Encounters:  08/22/24 138/84  07/06/24 116/70  05/23/24 118/76    Wt Readings from Last 3 Encounters:  08/22/24 140 lb 12.8 oz (63.9 kg)  07/06/24 140 lb 9.6 oz (63.8 kg)  05/23/24 146 lb 9.6 oz (66.5 kg)    Physical Exam Vitals reviewed.  Constitutional:      General: She is not in acute distress.    Appearance: Normal appearance. She is normal weight. She is not ill-appearing, toxic-appearing or diaphoretic.  HENT:     Head: Normocephalic.  Eyes:     General: No visual field deficit or scleral icterus.       Right eye: No discharge.        Left eye: No discharge.     Conjunctiva/sclera: Conjunctivae normal.  Musculoskeletal:         General: Normal range of motion.  Skin:    General: Skin is warm and dry.  Neurological:     General: No focal deficit present.     Mental Status: She is alert and oriented to person, place, and time. Mental status is at baseline.     Cranial Nerves: No cranial nerve deficit, dysarthria or facial asymmetry.  Psychiatric:        Mood and Affect: Mood normal.        Behavior: Behavior normal.        Thought Content: Thought content normal.        Judgment: Judgment normal.    No results found for: HGBA1C  Lab Results  Component Value Date   CREATININE 0.89 07/06/2024   CREATININE 0.90 04/07/2023   CREATININE 0.91 10/11/2022    Lab Results  Component Value Date   WBC 4.3 07/06/2024   HGB 13.1 07/06/2024   HCT 38.7 07/06/2024   PLT 200.0 07/06/2024   GLUCOSE 92 07/06/2024   CHOL 162 07/06/2024   TRIG 72.0 07/06/2024   HDL 50.30 07/06/2024   LDLCALC 97 07/06/2024   ALT 12 07/06/2024   AST 11 07/06/2024   NA 139 07/06/2024   K 3.8 07/06/2024   CL 103 07/06/2024   CREATININE 0.89 07/06/2024   BUN 19 07/06/2024   CO2 29 07/06/2024   TSH 2.87 07/06/2024    MM 3D SCREENING MAMMOGRAM BILATERAL BREAST Result Date: 02/09/2024 CLINICAL DATA:  Screening. EXAM: DIGITAL SCREENING BILATERAL MAMMOGRAM WITH TOMOSYNTHESIS AND CAD TECHNIQUE: Bilateral screening digital craniocaudal and mediolateral oblique mammograms were obtained. Bilateral screening digital breast tomosynthesis was performed. The images were evaluated with computer-aided detection. COMPARISON:  Previous exam(s). ACR Breast Density Category c: The breasts are heterogeneously dense, which may obscure small masses. FINDINGS: There are no findings suspicious for malignancy. IMPRESSION: No mammographic evidence of malignancy. A result letter of this screening mammogram will be mailed directly to the patient. RECOMMENDATION: Screening mammogram in one year. (Code:SM-B-01Y) BI-RADS CATEGORY  1: Negative. Electronically  Signed   By: Toribio Agreste M.D.   On: 02/09/2024 16:58    Assessment & Plan:  .There are no diagnoses linked to this encounter.   I spent 34 minutes on the day of this face to face encounter reviewing patient's  most recent visit with cardiology,  nephrology,  and neurology,  prior relevant surgical and non surgical procedures, recent  labs and imaging studies, counseling on weight management,  reviewing the assessment and plan with patient, and post visit ordering and reviewing of  diagnostics and therapeutics with patient  .   Follow-up: No **Note De-Identified Shimamoto Obfuscation** follow-ups on file.   Sandy LITTIE Kettering, MD

## 2024-08-22 NOTE — Telephone Encounter (Signed)
**Note De-Identified Casalino Obfuscation** Per review, seen by Dr Marylynn today.

## 2024-08-22 NOTE — Patient Instructions (Signed)
**Note De-Identified Mehlhoff Obfuscation** Ok to use tramadol 50 mg and 1000 mg tylenol every 6 to 8 hours while we are waiting for the labs   If the inflammatory markers are normal,  the  possible diagnosis of temporal arteritis is ruled out.

## 2024-08-23 DIAGNOSIS — R519 Headache, unspecified: Secondary | ICD-10-CM | POA: Insufficient documentation

## 2024-08-23 NOTE — Assessment & Plan Note (Signed)
**Note De-Identified Koob Obfuscation** Ddx includes temporal arteritis , persistent migraine, and referred pain from TMJ  labs reassuringly rule out arteritis,  pattern is not consistent with her history of migraine.  She has bruxism , untreated,  and pain with mastication .  Will prescribed celebrex 200 mg bid and continue tizanidine.  If no improvement in 2 weeks will obtain CT vs MRI to evaluate area.

## 2024-08-23 NOTE — Telephone Encounter (Signed)
**Note De-Identified Minniear Obfuscation** Spoke with pt and clarified how to take her medications.

## 2024-09-08 ENCOUNTER — Other Ambulatory Visit: Payer: Self-pay | Admitting: Internal Medicine

## 2024-09-12 NOTE — Telephone Encounter (Signed)
**Note De-Identified Allred Obfuscation** Was given for an acute visit when she saw Dr Marylynn.   Need to clarify if still having problems.  Please call pt.

## 2024-09-14 ENCOUNTER — Other Ambulatory Visit: Payer: Self-pay | Admitting: Internal Medicine

## 2024-09-28 ENCOUNTER — Ambulatory Visit: Admitting: Certified Registered Nurse Anesthetist

## 2024-09-28 ENCOUNTER — Encounter: Payer: Self-pay | Admitting: Gastroenterology

## 2024-09-28 ENCOUNTER — Encounter: Admission: RE | Disposition: A | Payer: Self-pay | Attending: Gastroenterology

## 2024-09-28 ENCOUNTER — Ambulatory Visit
Admission: RE | Admit: 2024-09-28 | Discharge: 2024-09-28 | Disposition: A | Attending: Gastroenterology | Admitting: Gastroenterology

## 2024-09-28 HISTORY — PX: POLYPECTOMY: SHX149

## 2024-09-28 HISTORY — PX: COLONOSCOPY: SHX5424

## 2024-09-28 SURGERY — COLONOSCOPY
Anesthesia: General

## 2024-09-28 MED ORDER — ONDANSETRON HCL 4 MG/2ML IJ SOLN
INTRAMUSCULAR | Status: DC | PRN
  Administered 2024-09-28: 4 mg via INTRAVENOUS

## 2024-09-28 MED ORDER — SODIUM CHLORIDE 0.9 % IV SOLN: INTRAVENOUS | Status: DC

## 2024-09-28 MED ORDER — DEXMEDETOMIDINE HCL IN NACL 80 MCG/20ML IV SOLN
INTRAVENOUS | Status: DC | PRN
  Administered 2024-09-28: 12 ug via INTRAVENOUS

## 2024-09-28 MED ORDER — PROPOFOL 500 MG/50ML IV EMUL
INTRAVENOUS | Status: DC | PRN
  Administered 2024-09-28: 80 mg via INTRAVENOUS
  Administered 2024-09-28: 20 mg via INTRAVENOUS
  Administered 2024-09-28: 125 ug/kg/min via INTRAVENOUS

## 2024-09-28 MED ORDER — LIDOCAINE HCL (CARDIAC) PF 100 MG/5ML IV SOSY
PREFILLED_SYRINGE | INTRAVENOUS | Status: DC | PRN
  Administered 2024-09-28: 50 mg via INTRAVENOUS

## 2024-09-28 NOTE — Anesthesia Postprocedure Evaluation (Signed)
**Note De-Identified Kit[place] Obfuscation** Anesthesia Post Note  Patient: Kitrina V Scritchfield  Procedure(s) Performed: COLONOSCOPY POLYPECTOMY, INTESTINE  Patient location during evaluation: Endoscopy Anesthesia Type: General Level of consciousness: awake and alert Pain management: pain level controlled Vital Signs Assessment: post-procedure vital signs reviewed and stable Respiratory status: spontaneous breathing, nonlabored ventilation, respiratory function stable and patient connected to nasal cannula oxygen Cardiovascular status: blood pressure returned to baseline and stable Postop Assessment: no apparent nausea or vomiting Anesthetic complications: no   No notable events documented.   Last Vitals:  Vitals:   09/28/24 0938 09/28/24 0947  BP: 106/74 109/84  Pulse: (!) 54 (!) 57  Resp: 15 20  Temp: (!) 36.4 C 36.4 C  SpO2: 100% 100%    Last Pain:  Vitals:   09/28/24 0947  TempSrc: Temporal  PainSc: 0-No pain                 Prentice Murphy

## 2024-09-28 NOTE — Anesthesia Procedure Notes (Signed)
**Note De-Identified Lawhorn Obfuscation** Date/Time: 09/28/2024 8:48 AM  Performed by: Dominica Krabbe, CRNAPre-anesthesia Checklist: Patient identified, Emergency Drugs available, Suction available, Patient being monitored and Timeout performed Patient Re-evaluated:Patient Re-evaluated prior to induction Oxygen Delivery Method: Nasal cannula Preoxygenation: Pre-oxygenation with 100% oxygen Induction Type: IV induction

## 2024-09-28 NOTE — Transfer of Care (Signed)
**Note De-Identified Boltz Obfuscation** Immediate Anesthesia Transfer of Care Note  Patient: Sandy Jefferson  Procedure(s) Performed: COLONOSCOPY POLYPECTOMY, INTESTINE  Patient Location: Endoscopy Unit  Anesthesia Type:General  Level of Consciousness: sedated, drowsy, and patient cooperative  Airway & Oxygen Therapy: Patient Spontanous Breathing  Post-op Assessment: Report given to RN and Post -op Vital signs reviewed and stable  Post vital signs: Reviewed and stable  Last Vitals:  Vitals Value Taken Time  BP    Temp    Pulse 62 09/28/24 09:19  Resp 16 09/28/24 09:19  SpO2 99 % 09/28/24 09:19    Last Pain:  Vitals:   09/28/24 0757  TempSrc: Temporal  PainSc: 0-No pain         Complications: No notable events documented.

## 2024-09-28 NOTE — Interval H&P Note (Signed)
**Note De-Identified Meaney Obfuscation** History and Physical Interval Note: Preprocedure H&P from 09/28/24  was reviewed and there was no interval change after seeing and examining the patient.  Written consent was obtained from the patient after discussion of risks, benefits, and alternatives. Patient has consented to proceed with Colonoscopy with possible intervention   09/28/2024 8:44 AM  Sandy Jefferson  has presented today for surgery, with the diagnosis of Screening for colon cancer (Z12.11) Family hx colonic polyps (Z83.719).  The various methods of treatment have been discussed with the patient and family. After consideration of risks, benefits and other options for treatment, the patient has consented to  Procedure(s): COLONOSCOPY (N/A) as a surgical intervention.  The patient's history has been reviewed, patient examined, no change in status, stable for surgery.  I have reviewed the patient's chart and labs.  Questions were answered to the patient's satisfaction.     Elspeth Ozell Jungling

## 2024-09-28 NOTE — Anesthesia Preprocedure Evaluation (Signed)
**Note De-Identified Nelms Obfuscation** Anesthesia Evaluation  Patient identified by MRN, date of birth, ID band Patient awake    Reviewed: Allergy & Precautions, H&P , NPO status , Patient's Chart, lab work & pertinent test results, reviewed documented beta blocker date and time   History of Anesthesia Complications (+) PROLONGED EMERGENCE and history of anesthetic complications  Airway Mallampati: I  TM Distance: >3 FB Neck ROM: full    Dental  (+) Dental Advidsory Given, Teeth Intact   Pulmonary neg pulmonary ROS   Pulmonary exam normal breath sounds clear to auscultation       Cardiovascular Exercise Tolerance: Good negative cardio ROS Normal cardiovascular exam Rhythm:regular Rate:Normal     Neuro/Psych negative neurological ROS  negative psych ROS   GI/Hepatic negative GI ROS, Neg liver ROS,,,  Endo/Other  neg diabetesHypothyroidism    Renal/GU negative Renal ROS  negative genitourinary   Musculoskeletal   Abdominal   Peds  Hematology negative hematology ROS (+)   Anesthesia Other Findings Past Medical History: No date: Bladder troubles No date: Migraine headache   Reproductive/Obstetrics negative OB ROS                              Anesthesia Physical Anesthesia Plan  ASA: 2  Anesthesia Plan: General   Post-op Pain Management:    Induction: Intravenous  PONV Risk Score and Plan: 3 and Propofol  infusion and TIVA  Airway Management Planned: Natural Airway and Nasal Cannula  Additional Equipment:   Intra-op Plan:   Post-operative Plan:   Informed Consent: I have reviewed the patients History and Physical, chart, labs and discussed the procedure including the risks, benefits and alternatives for the proposed anesthesia with the patient or authorized representative who has indicated his/her understanding and acceptance.     Dental Advisory Given  Plan Discussed with: Anesthesiologist, CRNA and  Surgeon  Anesthesia Plan Comments:         Anesthesia Quick Evaluation

## 2024-09-28 NOTE — H&P (Signed)
**Note De-Identified Herter Obfuscation** Pre-Procedure H&P   Patient ID: Sandy Jefferson is a 43 y.o. female.  Gastroenterology Provider: Elspeth Ozell Jungling, DO  Referring Provider: Dr. Glendia PCP: Glendia Shad, MD  Date: 09/28/2024  HPI Ms. Sandy Jefferson is a 43 y.o. female who presents today for Colonoscopy for Colorectal cancer screening, family history of colon cancer and polyps.  Initial screening colonoscopy.  Maternal great grandmother with colon cancer; father- colon polyps  Patient reports daily bowel movement without melena or hematochezia.   Past Medical History:  Diagnosis Date   Bladder troubles    Migraine headache     Past Surgical History:  Procedure Laterality Date   ABDOMINAL HYSTERECTOMY  2015   CESAREAN SECTION     HERNIA REPAIR     HYSTEROSCOPY     TOTAL ABDOMINAL HYSTERECTOMY W/ BILATERAL SALPINGOOPHORECTOMY  04/2014    Family History Maternal Great Grandmother- CRC Father- colon polyps No h/o GI disease or malignancy  Review of Systems  Constitutional:  Negative for activity change, appetite change, chills, diaphoresis, fatigue, fever and unexpected weight change.  HENT:  Negative for trouble swallowing and voice change.   Respiratory:  Negative for shortness of breath and wheezing.   Cardiovascular:  Negative for chest pain, palpitations and leg swelling.  Gastrointestinal:  Negative for abdominal distention, abdominal pain, anal bleeding, blood in stool, constipation, diarrhea, nausea, rectal pain and vomiting.  Musculoskeletal:  Negative for arthralgias and myalgias.  Skin:  Negative for color change and pallor.  Neurological:  Negative for dizziness, syncope and weakness.  Psychiatric/Behavioral:  Negative for confusion.   All other systems reviewed and are negative.    Medications No current facility-administered medications on file prior to encounter.   Current Outpatient Medications on File Prior to Encounter  Medication Sig Dispense Refill   cyanocobalamin  (VITAMIN  B12) 1000 MCG/ML injection INJECT 1 ML INTRAMUSCULARLY EVERY 30 DAYS. DISCARD 28 DAYS AFTER FIRSTUSE. USE NEEDLES AND SYRINGES FOR ADMINISTRATION. 3 mL 1   pantoprazole  (PROTONIX ) 40 MG tablet Take 1 tablet (40 mg total) by mouth daily. 90 tablet 3   SYNTHROID  50 MCG tablet TAKE 1 TABLET DAILY 90 tablet 1   Alpha-Lipoic Acid 600 MG TABS Take 600 mg by mouth daily.     celecoxib  (CELEBREX ) 200 MG capsule Take 1 capsule (200 mg total) by mouth 2 (two) times daily. 60 capsule 0   estradiol  (ESTRACE ) 1 MG tablet TAKE 1 TABLET DAILY 90 tablet 1   methylPREDNISolone  (MEDROL  DOSEPAK) 4 MG TBPK tablet Take as directed. 21 each 0   ondansetron  (ZOFRAN -ODT) 4 MG disintegrating tablet Take 1 tablet (4 mg total) by mouth every 8 (eight) hours as needed for nausea or vomiting. 20 tablet 0   SYRINGE-NEEDLE, DISP, 3 ML (B-D 3CC LUER-LOK SYR 25GX1) 25G X 1 3 ML MISC USE 1 SYRINGE FOR EACH B-12 ADMINISTRATION ONCE A MONTH 3 each 3   triamcinolone  cream (KENALOG ) 0.1 % Apply 1 Application topically 2 (two) times daily. Avoid applying to face, breasts and genitals. 30 g 0    Pertinent medications related to GI and procedure were reviewed by me with the patient prior to the procedure   Current Facility-Administered Medications:    0.9 %  sodium chloride  infusion, , Intravenous, Continuous, Jungling Elspeth Ozell, DO, Last Rate: 20 mL/hr at 09/28/24 0813, New Bag at 09/28/24 0813  sodium chloride  20 mL/hr at 09/28/24 9186       Allergies  Allergen Reactions   Propofol  Other (See Comments) **Note De-Identified Navarra Obfuscation** Other Reaction(s): Unknown   Aspirin Other (See Comments)    Other Reaction(s): Other (See Comments), Unknown  Other Reaction: Other reaction   Allergies were reviewed by me prior to the procedure  Objective   Body mass index is 21.46 kg/m. Vitals:   09/28/24 0757  BP: (!) 132/97  Pulse: 76  Resp: 16  Temp: (!) 97.4 F (36.3 C)  TempSrc: Temporal  SpO2: 99%  Weight: 62.1 kg  Height: 5' 7 (1.702 m)      Physical Exam Vitals and nursing note reviewed.  Constitutional:      General: She is not in acute distress.    Appearance: Normal appearance. She is not ill-appearing, toxic-appearing or diaphoretic.  HENT:     Head: Normocephalic and atraumatic.     Nose: Nose normal.     Mouth/Throat:     Mouth: Mucous membranes are moist.     Pharynx: Oropharynx is clear.  Eyes:     General: No scleral icterus.    Extraocular Movements: Extraocular movements intact.  Cardiovascular:     Rate and Rhythm: Normal rate and regular rhythm.     Heart sounds: Normal heart sounds. No murmur heard.    No friction rub. No gallop.  Pulmonary:     Effort: Pulmonary effort is normal. No respiratory distress.     Breath sounds: Normal breath sounds. No wheezing, rhonchi or rales.  Abdominal:     General: Bowel sounds are normal. There is no distension.     Palpations: Abdomen is soft.     Tenderness: There is no abdominal tenderness. There is no guarding or rebound.  Musculoskeletal:     Cervical back: Neck supple.     Right lower leg: No edema.     Left lower leg: No edema.  Skin:    General: Skin is warm and dry.     Coloration: Skin is not jaundiced or pale.  Neurological:     General: No focal deficit present.     Mental Status: She is alert and oriented to person, place, and time. Mental status is at baseline.  Psychiatric:        Mood and Affect: Mood normal.        Behavior: Behavior normal.        Thought Content: Thought content normal.        Judgment: Judgment normal.      Assessment:  Ms. Sandy Jefferson is a 43 y.o. female  who presents today for Colonoscopy for Colorectal cancer screening, family history of colon cancer .  Plan:  Colonoscopy with possible intervention today  Colonoscopy with possible biopsy, control of bleeding, polypectomy, and interventions as necessary has been discussed with the patient/patient representative. Informed consent was obtained from the  patient/patient representative after explaining the indication, nature, and risks of the procedure including but not limited to death, bleeding, perforation, missed neoplasm/lesions, cardiorespiratory compromise, and reaction to medications. Opportunity for questions was given and appropriate answers were provided. Patient/patient representative has verbalized understanding is amenable to undergoing the procedure.   Elspeth Ozell Jungling, DO  [facility]  Portions of the record may have been created with voice recognition software. Occasional wrong-word or 'sound-a-like' substitutions may have occurred due to the inherent limitations of voice recognition software.  Read the chart carefully and recognize, using context, where substitutions may have occurred.

## 2024-09-28 NOTE — Op Note (Signed)
**Note De-Identified [place] Obfuscation** [facility] Patient Name: Sandy Jefferson Procedure Date: 09/28/2024 8:38 AM MRN: [id number] Account #: [id number] Date of Birth: [birth year] Admit Type: Outpatient Age: 43 Room: [facility] ENDO ROOM 1 Gender: Female Note Status: Finalized Instrument Name: Peds Colonoscope [id number] Procedure:             Colonoscopy Indications:           Screening for colorectal malignant neoplasm, Family                         history of colon cancer and colon polyps Providers:             Elspeth Ozell Onita ROSALEA, DO Referring MD:          Allena Hamilton, MD (Referring MD) Complications:         No immediate complications. Estimated blood loss:                         Minimal. Procedure:             Pre-Anesthesia Assessment:                        - Prior to the procedure, a History and Physical was                         performed, and patient medications and allergies were                         reviewed. The patient is competent. The risks and                         benefits of the procedure and the sedation options and                         risks were discussed with the patient. All questions                         were answered and informed consent was obtained.                         Patient identification and proposed procedure were                         verified by the physician, the nurse, the anesthetist                         and the technician in the endoscopy suite. Mental                         Status Examination: alert and oriented. Airway                         Examination: normal oropharyngeal airway and neck                         mobility. Respiratory Examination: clear to                         auscultation. CV Examination: RRR, no murmurs, no **Note De-Identified [place] Obfuscation** S3                         or S4. Prophylactic Antibiotics: The patient does not                         require prophylactic antibiotics. Prior                         Anticoagulants: The patient  has taken no anticoagulant                         or antiplatelet agents. ASA Grade Assessment: II - A                         patient with mild systemic disease. After reviewing                         the risks and benefits, the patient was deemed in                         satisfactory condition to undergo the procedure. The                         anesthesia plan was to use monitored anesthesia care                         (MAC). Immediately prior to administration of                         medications, the patient was re-assessed for adequacy                         to receive sedatives. The heart rate, respiratory                         rate, oxygen saturations, blood pressure, adequacy of                         pulmonary ventilation, and response to care were                         monitored throughout the procedure. The physical                         status of the patient was re-assessed after the                         procedure.                        After obtaining informed consent, the colonoscope was                         passed under direct vision. Throughout the procedure,                         the patient's blood pressure, pulse, and oxygen **Note De-Identified [place] Obfuscation** saturations were monitored continuously. The                         Colonoscope was introduced through the anus and                         advanced to the the cecum, identified by appendiceal                         orifice and ileocecal valve. The colonoscopy was                         performed without difficulty. The patient tolerated                         the procedure well. The quality of the bowel                         preparation was evaluated using the BBPS [facility] Bowel                         Preparation Scale) with scores of: Right Colon = 3                         (entire mucosa seen well with no residual staining,                         small fragments of stool or opaque liquid),  Transverse                         Colon = 3 (entire mucosa seen well with no residual                         staining, small fragments of stool or opaque liquid)                         and Left Colon = 2 (minor amount of residual staining,                         small fragments of stool and/or opaque liquid, but                         mucosa seen well). The total BBPS score equals 8. The                         quality of the bowel preparation was excellent. The                         ileocecal valve, appendiceal orifice, and rectum were                         photographed. Findings:      The perianal and digital rectal examinations were normal. Pertinent       negatives include normal sphincter tone.      Retroflexion in the right colon was performed.      Non-bleeding internal hemorrhoids were found during retroflexion. The **Note De-Identified [place] Obfuscation** hemorrhoids were Grade I (internal hemorrhoids that do not prolapse).      A 4 to 5 mm polyp was found in the cecum. The polyp was sessile.       Polypectomy was attempted, initially using a cold snare. Polyp resection       was incomplete with this device. This intervention then required a       different device and polypectomy technique. The polyp was removed with a       jumbo cold forceps. Resection and retrieval were complete. Estimated       blood loss was minimal.      The exam was otherwise without abnormality on direct and retroflexion       views. Impression:            - Non-bleeding internal hemorrhoids.                        - One 4 to 5 mm polyp in the cecum, removed with a                         jumbo cold forceps. Resected and retrieved.                        - The examination was otherwise normal on direct and                         retroflexion views. Recommendation:        - Patient has a contact number available for                         emergencies. The signs and symptoms of potential                         delayed  complications were discussed with the patient.                         Return to normal activities tomorrow. Written                         discharge instructions were provided to the patient.                        - Discharge patient to home.                        - Resume previous diet.                        - Continue present medications.                        - No ibuprofen, naproxen, or other non-steroidal                         anti-inflammatory drugs for 5 days after polyp removal.                        - Await pathology results.                        - **Note De-Identified [place] Obfuscation** Repeat colonoscopy for surveillance based on                         pathology results.                        - Return to referring physician as previously                         scheduled.                        - The findings and recommendations were discussed with                         the patient. Procedure Code(s):     --- Professional ---                        [phone], Colonoscopy, flexible; with biopsy, single or                         multiple Diagnosis Code(s):     --- Professional ---                        Z12.11, Encounter for screening for malignant neoplasm                         of colon                        D12.0, Benign neoplasm of cecum                        K64.0, First degree hemorrhoids CPT copyright 2022 [employer]. All rights reserved. The codes documented in this report are preliminary and upon coder review may  be revised to meet current compliance requirements. Attending Participation:      I personally performed the entire procedure. Elspeth Jungling, DO Elspeth Ozell Jungling DO, DO 09/28/2024 9:24:22 AM This report has been signed electronically. Number of Addenda: 0 Note Initiated On: 09/28/2024 8:38 AM Scope Withdrawal Time: 0 hours 11 minutes 24 seconds  Total Procedure Duration: 0 hours 17 minutes 53 seconds  Estimated Blood Loss:  Estimated blood loss was minimal.       [facility]

## 2024-10-01 LAB — SURGICAL PATHOLOGY

## 2024-10-05 NOTE — Telephone Encounter (Signed)
**Note De-Identified [place] Obfuscation** Phone call to pt, does not need medication.  Refusing refill.

## 2024-10-11 ENCOUNTER — Encounter: Payer: Self-pay | Admitting: Internal Medicine

## 2025-01-03 ENCOUNTER — Ambulatory Visit: Admitting: Internal Medicine
